# Patient Record
Sex: Female | Born: 1960 | Hispanic: No | Marital: Married | Smoking: Never smoker
Health system: Southern US, Community
[De-identification: ages and names within clinical notes are randomized; demographics above are authoritative.]

## PROBLEM LIST (undated history)

## (undated) HISTORY — PX: ABDOMINAL HYSTERECTOMY: SHX81

---

## 2007-04-20 HISTORY — PX: CHOLECYSTECTOMY: SHX55

## 2012-04-19 HISTORY — PX: BREAST BIOPSY: SHX20

## 2016-11-10 LAB — BASIC METABOLIC PANEL
BUN: 11 (ref 4–21)
CO2: 27 — AB (ref 13–22)
Chloride: 105 (ref 99–108)
Creatinine: 0.8 (ref 0.5–1.1)
Glucose: 83
Potassium: 4.9 (ref 3.4–5.3)
Sodium: 141 (ref 137–147)

## 2016-11-10 LAB — LIPID PANEL
Cholesterol: 202 — AB (ref 0–200)
HDL: 113 — AB (ref 35–70)
LDL Cholesterol: 77
Triglycerides: 43 (ref 40–160)

## 2016-11-10 LAB — CBC: RBC: 4.16 (ref 3.87–5.11)

## 2016-11-10 LAB — COMPREHENSIVE METABOLIC PANEL
Albumin: 4.1 (ref 3.5–5.0)
Calcium: 9.8 (ref 8.7–10.7)
GFR calc Af Amer: 99
Globulin: 2.8

## 2016-11-10 LAB — CBC AND DIFFERENTIAL
HCT: 35 — AB (ref 36–46)
Hemoglobin: 11.6 — AB (ref 12.0–16.0)
Platelets: 399 (ref 150–399)
WBC: 7.5

## 2016-11-10 LAB — VITAMIN D 25 HYDROXY (VIT D DEFICIENCY, FRACTURES): Vit D, 25-Hydroxy: 25

## 2016-11-10 LAB — HEPATIC FUNCTION PANEL
ALT: 10 (ref 7–35)
AST: 13 (ref 13–35)
Alkaline Phosphatase: 80 (ref 25–125)
Bilirubin, Total: 0.6

## 2017-01-14 LAB — HM COLONOSCOPY

## 2019-06-05 ENCOUNTER — Ambulatory Visit (INDEPENDENT_AMBULATORY_CARE_PROVIDER_SITE_OTHER): Payer: BC Managed Care – PPO | Admitting: Family Medicine

## 2019-06-05 ENCOUNTER — Encounter: Payer: Self-pay | Admitting: Family Medicine

## 2019-06-05 ENCOUNTER — Other Ambulatory Visit: Payer: Self-pay

## 2019-06-05 VITALS — BP 128/78 | HR 72 | Ht 62.0 in | Wt 148.0 lb

## 2019-06-05 DIAGNOSIS — Z5181 Encounter for therapeutic drug level monitoring: Secondary | ICD-10-CM

## 2019-06-05 DIAGNOSIS — Z7689 Persons encountering health services in other specified circumstances: Secondary | ICD-10-CM

## 2019-06-05 NOTE — Progress Notes (Signed)
Date:  06/05/2019   Name:  Linda Salas   DOB:  06-Jan-1961   MRN:  [id number]   Chief Complaint: Establish Care (From [facility] )  Patient is a 59 year old female who presents for a establish care exam. The patient reports the following problems: no concerns. Health maintenance has been reviewed up to date.   No results found for: CREATININE, BUN, NA, K, CL, CO2 No results found for: CHOL, HDL, LDLCALC, LDLDIRECT, TRIG, CHOLHDL No results found for: TSH No results found for: HGBA1C   Review of Systems  Constitutional: Negative.  Negative for chills, fatigue, fever and unexpected weight change.  HENT: Negative for congestion, ear discharge, ear pain, rhinorrhea, sinus pressure, sneezing and sore throat.   Eyes: Negative for photophobia, pain, discharge, redness and itching.  Respiratory: Negative for cough, shortness of breath, wheezing and stridor.   Cardiovascular: Negative for chest pain, palpitations and leg swelling.  Gastrointestinal: Negative for abdominal pain, blood in stool, constipation, diarrhea, nausea and vomiting.  Endocrine: Negative for cold intolerance, heat intolerance, polydipsia, polyphagia and polyuria.  Genitourinary: Negative for dysuria, flank pain, frequency, hematuria, menstrual problem, pelvic pain, urgency, vaginal bleeding and vaginal discharge.  Musculoskeletal: Negative for arthralgias, back pain and myalgias.  Skin: Negative for rash.  Allergic/Immunologic: Negative for environmental allergies and food allergies.  Neurological: Negative for dizziness, weakness, light-headedness, numbness and headaches.  Hematological: Negative for adenopathy. Does not bruise/bleed easily.  Psychiatric/Behavioral: Negative for dysphoric mood. The patient is not nervous/anxious.     There are no problems to display for this patient.   Allergies  Allergen Reactions  . Hydrochlorothiazide Shortness Of Breath and Other (See Comments)    Insomnia.     Past  Surgical History:  Procedure Laterality Date  . ABDOMINAL HYSTERECTOMY    . CHOLECYSTECTOMY  2009    Social History   Tobacco Use  . Smoking status: Never Smoker  . Smokeless tobacco: Never Used  Substance Use Topics  . Alcohol use: Never  . Drug use: Never     Medication list has been reviewed and updated.  Current Meds  Medication Sig  . Ascorbic Acid (VITAMIN C) 1000 MG tablet Take 1,000 mg by mouth daily.  . fluticasone (FLONASE) 50 MCG/ACT nasal spray Place 2 sprays into both nostrils daily. OTC    PHQ 2/9 Scores 06/05/2019  PHQ - 2 Score 0    BP Readings from Last 3 Encounters:  06/05/19 128/78    Physical Exam Vitals and nursing note reviewed.  Constitutional:      General: She is not in acute distress.    Appearance: She is not diaphoretic.  HENT:     Head: Normocephalic and atraumatic.     Right Ear: Tympanic membrane, ear canal and external ear normal. There is no impacted cerumen.     Left Ear: Tympanic membrane, ear canal and external ear normal. There is no impacted cerumen.     Nose: Nose normal. No congestion or rhinorrhea.  Eyes:     General:        Right eye: No discharge.        Left eye: No discharge.     Conjunctiva/sclera: Conjunctivae normal.     Pupils: Pupils are equal, round, and reactive to light.  Neck:     Thyroid: No thyromegaly.     Vascular: No carotid bruit or JVD.  Cardiovascular:     Rate and Rhythm: Normal rate and regular rhythm.  Pulses: Normal pulses.     Heart sounds: Normal heart sounds, S1 normal and S2 normal. No murmur. No systolic murmur. No diastolic murmur. No friction rub. No gallop. No S3 or S4 sounds.   Pulmonary:     Effort: Pulmonary effort is normal.     Breath sounds: Normal breath sounds. No wheezing, rhonchi or rales.  Abdominal:     General: Bowel sounds are normal.     Palpations: Abdomen is soft. There is no mass.     Tenderness: There is no abdominal tenderness. There is no guarding.    Musculoskeletal:        General: Normal range of motion.     Cervical back: Normal range of motion and neck supple. No tenderness.  Lymphadenopathy:     Cervical: No cervical adenopathy.  Skin:    General: Skin is warm and dry.  Neurological:     Mental Status: She is alert.     Deep Tendon Reflexes: Reflexes are normal and symmetric.     Wt Readings from Last 3 Encounters:  06/05/19 148 lb (67.1 kg)    BP 128/78   Pulse 72   Ht 5\' 2"  (1.575 m)   Wt 148 lb (67.1 kg)   LMP  (Exact Date)   BMI 27.07 kg/m   Assessment and Plan:  1. Establishing care with new doctor, encounter for Patient is establishing care with a new physician having moved here from [place].  Patient has an unremarkable medical history except for hysterectomy and cholecystectomy.  Patient is currently up-to-date on her health maintenance with only the need for breast exam for mammogram and will be returning for physical exam in the recent future.

## 2019-06-15 ENCOUNTER — Encounter: Payer: Self-pay | Admitting: Family Medicine

## 2019-06-15 ENCOUNTER — Ambulatory Visit (INDEPENDENT_AMBULATORY_CARE_PROVIDER_SITE_OTHER): Payer: BC Managed Care – PPO | Admitting: Family Medicine

## 2019-06-15 ENCOUNTER — Other Ambulatory Visit: Payer: Self-pay

## 2019-06-15 VITALS — BP 110/80 | HR 92 | Ht 62.0 in | Wt 139.0 lb

## 2019-06-15 DIAGNOSIS — Z0000 Encounter for general adult medical examination without abnormal findings: Secondary | ICD-10-CM

## 2019-06-15 DIAGNOSIS — Z124 Encounter for screening for malignant neoplasm of cervix: Secondary | ICD-10-CM

## 2019-06-15 NOTE — Progress Notes (Addendum)
Date:  06/15/2019   Name:  Lucianne Smestad   DOB:  1961-04-18   MRN:  [id number]   Chief Complaint: Annual Exam (needs to sign ROI for [facility] mammo/ wants referral for OBGYN)  Patient is a 59 year old female who presents for a comprehensive physical exam. The patient reports the following problems: none. Health maintenance has been reviewed up to date.   Lab Results  Component Value Date   CREATININE 0.8 11/10/2016   BUN 11 11/10/2016   NA 141 11/10/2016   K 4.9 11/10/2016   CL 105 11/10/2016   CO2 27 (A) 11/10/2016   Lab Results  Component Value Date   CHOL 202 (A) 11/10/2016   HDL 113 (A) 11/10/2016   LDLCALC 77 11/10/2016   TRIG 43 11/10/2016   No results found for: TSH No results found for: HGBA1C   Review of Systems  Constitutional: Negative.  Negative for chills, fatigue, fever and unexpected weight change.  HENT: Negative for congestion, ear discharge, ear pain, mouth sores, nosebleeds, rhinorrhea, sinus pressure, sneezing and sore throat.   Eyes: Negative for photophobia, pain, discharge, redness and itching.  Respiratory: Negative for cough, shortness of breath, wheezing and stridor.   Gastrointestinal: Negative for abdominal pain, blood in stool, constipation, diarrhea, nausea and vomiting.  Endocrine: Negative for cold intolerance, heat intolerance, polydipsia, polyphagia and polyuria.  Genitourinary: Negative for dysuria, flank pain, frequency, hematuria, menstrual problem, pelvic pain, urgency, vaginal bleeding and vaginal discharge.  Musculoskeletal: Negative for arthralgias, back pain and myalgias.  Skin: Negative for rash.  Allergic/Immunologic: Negative for environmental allergies and food allergies.  Neurological: Negative for dizziness, weakness, light-headedness, numbness and headaches.  Hematological: Negative for adenopathy. Does not bruise/bleed easily.  Psychiatric/Behavioral: Negative for dysphoric mood. The patient is not nervous/anxious.     There  are no problems to display for this patient.   Allergies  Allergen Reactions  . Hydrochlorothiazide Shortness Of Breath and Other (See Comments)    Insomnia.     Past Surgical History:  Procedure Laterality Date  . ABDOMINAL HYSTERECTOMY    . CHOLECYSTECTOMY  2009    Social History   Tobacco Use  . Smoking status: Never Smoker  . Smokeless tobacco: Never Used  Substance Use Topics  . Alcohol use: Never  . Drug use: Never     Medication list has been reviewed and updated.  Current Meds  Medication Sig  . Ascorbic Acid (VITAMIN C) 1000 MG tablet Take 1,000 mg by mouth daily.  . fluticasone (FLONASE) 50 MCG/ACT nasal spray Place 2 sprays into both nostrils daily. OTC    PHQ 2/9 Scores 06/15/2019 06/05/2019  PHQ - 2 Score 0 0  PHQ- 9 Score 0 -    BP Readings from Last 3 Encounters:  06/15/19 110/80  06/05/19 128/78    Physical Exam Vitals and nursing note reviewed.  Constitutional:      Appearance: Normal appearance. She is well-developed.  HENT:     Head: Normocephalic.     Jaw: There is normal jaw occlusion.     Right Ear: Hearing, tympanic membrane, ear canal and external ear normal.     Left Ear: Hearing, tympanic membrane, ear canal and external ear normal.     Nose: Nose normal. No congestion.     Right Turbinates: Not enlarged, swollen or pale.     Left Turbinates: Not enlarged, swollen or pale.     Mouth/Throat:     Lips: Pink.     Mouth:  Mucous membranes are moist.     Dentition: Normal dentition.     Pharynx: Oropharynx is clear. No pharyngeal swelling, oropharyngeal exudate, posterior oropharyngeal erythema or uvula swelling.  Eyes:     General: Lids are everted, no foreign bodies appreciated. Vision grossly intact. Gaze aligned appropriately. No scleral icterus.       Left eye: No foreign body or hordeolum.     Extraocular Movements: Extraocular movements intact.     Conjunctiva/sclera: Conjunctivae normal.     Right eye: Right conjunctiva is  not injected.     Left eye: Left conjunctiva is not injected.     Pupils: Pupils are equal, round, and reactive to light.  Neck:     Thyroid: No thyroid mass, thyromegaly or thyroid tenderness.     Vascular: No JVD.     Trachea: No tracheal deviation.  Cardiovascular:     Rate and Rhythm: Normal rate and regular rhythm.     Chest Wall: PMI is not displaced.     Pulses: Normal pulses.          Carotid pulses are 2+ on the right side and 2+ on the left side.      Radial pulses are 2+ on the right side and 2+ on the left side.       Femoral pulses are 2+ on the right side and 2+ on the left side.      Popliteal pulses are 2+ on the right side and 2+ on the left side.       Dorsalis pedis pulses are 2+ on the right side and 2+ on the left side.       Posterior tibial pulses are 2+ on the right side and 2+ on the left side.     Heart sounds: Normal heart sounds, S1 normal and S2 normal. No murmur. No systolic murmur. No diastolic murmur. No friction rub. No gallop. No S3 or S4 sounds.   Pulmonary:     Effort: Pulmonary effort is normal. No respiratory distress.     Breath sounds: Normal breath sounds. No decreased air movement. No decreased breath sounds, wheezing, rhonchi or rales.  Chest:     Breasts:        Right: Normal. No swelling, bleeding, inverted nipple, mass, nipple discharge, skin change or tenderness.        Left: Normal. No swelling, bleeding, inverted nipple, mass, nipple discharge, skin change or tenderness.  Abdominal:     General: Bowel sounds are normal.     Palpations: Abdomen is soft. There is no hepatomegaly, splenomegaly or mass.     Tenderness: There is no abdominal tenderness. There is no right CVA tenderness, left CVA tenderness, guarding or rebound.  Genitourinary:    Rectum: Normal. Guaiac result negative. No mass.  Musculoskeletal:        General: No tenderness. Normal range of motion.     Cervical back: Full passive range of motion without pain, normal range  of motion and neck supple.     Right lower leg: No edema.     Left lower leg: No edema.  Lymphadenopathy:     Head:     Right side of head: No submandibular adenopathy.     Left side of head: No submandibular adenopathy.     Cervical: No cervical adenopathy.     Right cervical: No superficial, deep or posterior cervical adenopathy.    Left cervical: No superficial, deep or posterior cervical adenopathy.     Upper  Body:     Right upper body: No supraclavicular or axillary adenopathy.     Left upper body: No supraclavicular or axillary adenopathy.     Lower Body: No right inguinal adenopathy. No left inguinal adenopathy.  Skin:    General: Skin is warm.     Capillary Refill: Capillary refill takes less than 2 seconds.     Findings: No rash.  Neurological:     Mental Status: She is alert and oriented to person, place, and time.     Cranial Nerves: Cranial nerves are intact. No cranial nerve deficit.     Sensory: Sensation is intact.     Motor: Motor function is intact.     Deep Tendon Reflexes: Reflexes normal.     Reflex Scores:      Tricep reflexes are 2+ on the right side and 2+ on the left side.      Bicep reflexes are 2+ on the right side and 2+ on the left side.      Brachioradialis reflexes are 2+ on the right side and 2+ on the left side.      Patellar reflexes are 2+ on the right side and 2+ on the left side.      Achilles reflexes are 2+ on the right side and 2+ on the left side. Psychiatric:        Mood and Affect: Mood is not anxious or depressed.     Wt Readings from Last 3 Encounters:  06/15/19 139 lb (63 kg)  06/05/19 148 lb (67.1 kg)    BP 110/80   Pulse 92   Ht 5\' 2"  (1.575 m)   Wt 139 lb (63 kg)   BMI 25.42 kg/m   Assessment and Plan: 1. Annual physical exam No subjective objective concerns noted during the history and physical exam.  Patient's previous encounter as well as previous labs, and imaging were reviewed.Winter Jocelyn is a 59 y.o. female who  presents today for her Complete Annual Exam. She feels well. She reports exercising . She reports she is sleeping well. Immunizations are reviewed and recommendations provided.   Age appropriate screening tests are discussed. Counseling given for risk factor reduction interventions. - Comprehensive metabolic panel - Lipid Panel With LDL/HDL Ratio - CBC with Differential/Platelet  2. Cervical cancer screening Patient request to be seen by gynecology in the area particularly here and [place] and we are referring patient to [facility] [place] for routine GYN care. - Ambulatory referral to Obstetrics / Gynecology

## 2019-06-15 NOTE — Patient Instructions (Signed)

## 2019-06-16 LAB — LIPID PANEL WITH LDL/HDL RATIO
Cholesterol, Total: 223 mg/dL — ABNORMAL HIGH (ref 100–199)
HDL: 116 mg/dL (ref >39)
LDL Chol Calc (NIH): 100 mg/dL — ABNORMAL HIGH (ref 0–99)
LDL/HDL Ratio: 0.9 ratio (ref 0.0–3.2)
Triglycerides: 39 mg/dL (ref 0–149)
VLDL Cholesterol Cal: 7 mg/dL (ref 5–40)

## 2019-06-16 LAB — COMPREHENSIVE METABOLIC PANEL
ALT: 12 IU/L (ref 0–32)
AST: 17 IU/L (ref 0–40)
Albumin/Globulin Ratio: 1.6 (ref 1.2–2.2)
Albumin: 4.5 g/dL (ref 3.8–4.9)
Alkaline Phosphatase: 99 IU/L (ref 39–117)
BUN/Creatinine Ratio: 8 — ABNORMAL LOW (ref 12–28)
BUN: 7 mg/dL — ABNORMAL LOW (ref 8–27)
Bilirubin Total: 0.4 mg/dL (ref 0.0–1.2)
CO2: 25 mmol/L (ref 20–29)
Calcium: 10 mg/dL (ref 8.7–10.3)
Chloride: 102 mmol/L (ref 96–106)
Creatinine, Ser: 0.91 mg/dL (ref 0.57–1.00)
GFR calc Af Amer: 79 mL/min/{1.73_m2} (ref >59)
GFR calc non Af Amer: 69 mL/min/{1.73_m2} (ref >59)
Globulin, Total: 2.8 g/dL (ref 1.5–4.5)
Glucose: 72 mg/dL (ref 65–99)
Potassium: 4.9 mmol/L (ref 3.5–5.2)
Sodium: 142 mmol/L (ref 134–144)
Total Protein: 7.3 g/dL (ref 6.0–8.5)

## 2019-06-16 LAB — CBC WITH DIFFERENTIAL/PLATELET
Basophils Absolute: 0 10*3/uL (ref 0.0–0.2)
Basos: 0 %
EOS (ABSOLUTE): 0 10*3/uL (ref 0.0–0.4)
Eos: 0 %
Hematocrit: 35.7 % (ref 34.0–46.6)
Hemoglobin: 12.1 g/dL (ref 11.1–15.9)
Immature Grans (Abs): 0 10*3/uL (ref 0.0–0.1)
Immature Granulocytes: 0 %
Lymphocytes Absolute: 3 10*3/uL (ref 0.7–3.1)
Lymphs: 43 %
MCH: 28.5 pg (ref 26.6–33.0)
MCHC: 33.9 g/dL (ref 31.5–35.7)
MCV: 84 fL (ref 79–97)
Monocytes Absolute: 0.5 10*3/uL (ref 0.1–0.9)
Monocytes: 7 %
Neutrophils Absolute: 3.4 10*3/uL (ref 1.4–7.0)
Neutrophils: 50 %
Platelets: 455 10*3/uL — ABNORMAL HIGH (ref 150–450)
RBC: 4.25 x10E6/uL (ref 3.77–5.28)
RDW: 13.2 % (ref 11.7–15.4)
WBC: 7 10*3/uL (ref 3.4–10.8)

## 2019-06-18 ENCOUNTER — Telehealth: Payer: Self-pay | Admitting: Obstetrics and Gynecology

## 2019-06-18 NOTE — Telephone Encounter (Signed)
[facility] referring for Cervical cancer screening. Called and left voicemail for patient to call back to be schedule

## 2019-07-05 ENCOUNTER — Encounter: Payer: BC Managed Care – PPO | Admitting: Advanced Practice Midwife

## 2019-07-25 ENCOUNTER — Encounter: Payer: BC Managed Care – PPO | Admitting: Advanced Practice Midwife

## 2019-07-25 ENCOUNTER — Other Ambulatory Visit: Payer: Self-pay

## 2019-07-25 ENCOUNTER — Encounter: Payer: Self-pay | Admitting: Advanced Practice Midwife

## 2019-07-26 NOTE — Progress Notes (Signed)
This encounter was created in error - please disregard.

## 2019-08-09 ENCOUNTER — Other Ambulatory Visit: Payer: Self-pay | Admitting: Family Medicine

## 2019-08-09 DIAGNOSIS — R922 Inconclusive mammogram: Secondary | ICD-10-CM

## 2019-08-20 ENCOUNTER — Other Ambulatory Visit: Payer: BC Managed Care – PPO

## 2019-08-27 ENCOUNTER — Ambulatory Visit
Admission: RE | Admit: 2019-08-27 | Discharge: 2019-08-27 | Disposition: A | Discharge status: Home / Self Care | Payer: BC Managed Care – PPO | Source: Ambulatory Visit | Attending: Family Medicine | Admitting: Family Medicine

## 2019-08-27 DIAGNOSIS — R922 Inconclusive mammogram: Secondary | ICD-10-CM

## 2020-04-21 ENCOUNTER — Ambulatory Visit
Admission: EM | Admit: 2020-04-21 | Discharge: 2020-04-21 | Disposition: A | Discharge status: Home / Self Care | Payer: BC Managed Care – PPO | Attending: Sports Medicine | Admitting: Sports Medicine

## 2020-04-21 ENCOUNTER — Other Ambulatory Visit: Payer: Self-pay

## 2020-04-21 DIAGNOSIS — R0981 Nasal congestion: Secondary | ICD-10-CM | POA: Diagnosis present

## 2020-04-21 DIAGNOSIS — R059 Cough, unspecified: Secondary | ICD-10-CM | POA: Diagnosis not present

## 2020-04-21 DIAGNOSIS — U071 COVID-19: Secondary | ICD-10-CM | POA: Insufficient documentation

## 2020-04-21 NOTE — Discharge Instructions (Addendum)
Your Covid test and influenza test were sent to the hospital.  You should check my chart as the results may take 24 to 48 hours.  If you are positive, the nurse will call you.  If you are negative you will not get a call.  You should quarantine appropriately until you know your test results.  Any questions or concerns or any symptoms of worsening chest pain or shortness of breath you should go directly to the emergency room or call 911.

## 2020-04-21 NOTE — ED Triage Notes (Signed)
Pt with cough and congestion starting on Thursday.

## 2020-04-21 NOTE — ED Provider Notes (Signed)
[facility]    CSN: [id number] Arrival date & time: 04/21/20  1003      History   Chief Complaint Chief Complaint  Patient presents with  . Cough    HPI Linda Salas is a 60 y.o. female.   Patient pleasant 60 year old female who presents with her husband for evaluation of 4 to 5 days of a dry nonproductive cough.  She denies any fevers or chills.  No sore throat headache shortness of breath or chest pain.  No abdominal pain.  No nausea vomiting or diarrhea.  She has been taking some Mucinex DM over-the-counter with limited success.  Denies any Covid exposure.  She is not vaccinated currently.  No red flag signs or symptoms elicited on history.     History reviewed. No pertinent past medical history.  There are no problems to display for this patient.   Past Surgical History:  Procedure Laterality Date  . ABDOMINAL HYSTERECTOMY    . BREAST BIOPSY Left 2014  . CHOLECYSTECTOMY  2009    OB History   No obstetric history on file.      Home Medications    Prior to Admission medications   Medication Sig Start Date End Date Taking? Authorizing Provider  Ascorbic Acid (VITAMIN C) 1000 MG tablet Take 1,000 mg by mouth daily.    [provider]  cholecalciferol (VITAMIN D3) 25 MCG (1000 UNIT) tablet Take 1,000 Units by mouth daily.    [provider]  fluticasone (FLONASE) 50 MCG/ACT nasal spray Place 2 sprays into both nostrils daily. OTC    [provider]    Family History History reviewed. No pertinent family history.  Social History Social History   Tobacco Use  . Smoking status: Never Smoker  . Smokeless tobacco: Never Used  Vaping Use  . Vaping Use: Never used  Substance Use Topics  . Alcohol use: Never  . Drug use: Never     Allergies   Hydrochlorothiazide   Review of Systems Review of Systems  Constitutional: Negative for activity change, appetite change, chills and fatigue.  HENT: Negative for congestion,  ear pain, sinus pressure, sinus pain and sore throat.   Eyes: Negative for pain.  Respiratory: Positive for cough. Negative for shortness of breath.   Cardiovascular: Negative for chest pain.  Gastrointestinal: Negative for abdominal pain.  Genitourinary: Negative for dysuria.  Skin: Negative for color change, pallor, rash and wound.  Neurological: Negative for dizziness, syncope, weakness, light-headedness, numbness and headaches.  All other systems reviewed and are negative.    Physical Exam Triage Vital Signs ED Triage Vitals  Enc Vitals Group     BP 04/21/20 1041 126/66     Pulse Rate 04/21/20 1041 88     Resp 04/21/20 1041 16     Temp 04/21/20 1041 98.3 F (36.8 C)     Temp Source 04/21/20 1041 Oral     SpO2 04/21/20 1041 100 %     Weight 04/21/20 1028 135 lb (61.2 kg)     Height 04/21/20 1028 5\' 2"  (1.575 m)     Head Circumference --      Peak Flow --      Pain Score 04/21/20 1027 0     Pain Loc --      Pain Edu? --      Excl. in GC? --    No data found.  Updated Vital Signs BP 126/66 (BP Location: Left Arm)   Pulse 88   Temp 98.3 F (  36.8 C) (Oral)   Resp 16   Ht 5\' 2"  (1.575 m)   Wt 61.2 kg   SpO2 100%   BMI 24.69 kg/m   Visual Acuity Right Eye Distance:   Left Eye Distance:   Bilateral Distance:    Right Eye Near:   Left Eye Near:    Bilateral Near:     Physical Exam Vitals and nursing note reviewed.  Constitutional:      Appearance: Normal appearance. She is obese.  HENT:     Head: Normocephalic and atraumatic.     Right Ear: Tympanic membrane normal.     Left Ear: Tympanic membrane normal.     Nose: Congestion present. No rhinorrhea.     Mouth/Throat:     Mouth: Mucous membranes are moist.     Pharynx: Posterior oropharyngeal erythema present. No oropharyngeal exudate.  Eyes:     Extraocular Movements: Extraocular movements intact.     Conjunctiva/sclera: Conjunctivae normal.     Pupils: Pupils are equal, round, and reactive to light.   Cardiovascular:     Rate and Rhythm: Normal rate and regular rhythm.     Pulses: Normal pulses.     Heart sounds: Normal heart sounds. No murmur heard. No friction rub. No gallop.   Pulmonary:     Effort: Pulmonary effort is normal. No respiratory distress.     Breath sounds: Normal breath sounds. No stridor. No wheezing, rhonchi or rales.  Musculoskeletal:     Cervical back: Normal range of motion and neck supple. No rigidity.  Lymphadenopathy:     Cervical: No cervical adenopathy.  Skin:    General: Skin is warm and dry.     Capillary Refill: Capillary refill takes less than 2 seconds.  Neurological:     General: No focal deficit present.     Mental Status: She is alert and oriented to person, place, and time.  Psychiatric:        Mood and Affect: Mood normal.        Behavior: Behavior normal.      UC Treatments / Results  Labs (all labs ordered are listed, but only abnormal results are displayed) Labs Reviewed  SARS CORONAVIRUS 2 (TAT 6-24 HRS)    EKG   Radiology No results found.  Procedures Procedures (including critical care time)  Medications Ordered in UC Medications - No data to display  Initial Impression / Assessment and Plan / UC Course  I have reviewed the triage vital signs and the nursing notes.  Pertinent labs & imaging results that were available during my care of the patient were reviewed by me and considered in my medical decision making (see chart for details).   Clinical impression: 4 to 5 days of dry nonproductive cough.  No other symptoms were offered by the patient.  Her physical exam and vitals are reassuring.  Treatment plan: 1.  The findings and treatment plan were discussed in detail with the patient.  The patient was in agreement. 2.  Go ahead and test her for Covid.  We do not have any rapid test available.  We will send it to the hospital.  We will contact the patient if she is positive.  I have asked her to go ahead and  quarantine and isolate pending the results from her test. 3.  Supportive care over-the-counter meds as needed, Tylenol Motrin for fever discomfort. 4.  She stays at home and does not work outside the home so no work note is necessary.  5.  Red flag signs and symptoms were discussed in detail and when to seek out immediate medical attention. 6.  Should follow-up with [place] as needed.     Final Clinical Impressions(s) / UC Diagnoses   Final diagnoses:  Cough  Head congestion     Discharge Instructions     Your Covid test and influenza test were sent to the hospital.  You should check my chart as the results may take 24 to 48 hours.  If you are positive, the nurse will call you.  If you are negative you will not get a call.  You should quarantine appropriately until you know your test results.  Any questions or concerns or any symptoms of worsening chest pain or shortness of breath you should go directly to the emergency room or call 911.    ED Prescriptions    None     PDMP not reviewed this encounter.   Delton See, MD 04/23/20 2238

## 2020-04-22 LAB — SARS CORONAVIRUS 2 (TAT 6-24 HRS): SARS Coronavirus 2: POSITIVE — AB

## 2020-06-17 ENCOUNTER — Ambulatory Visit (INDEPENDENT_AMBULATORY_CARE_PROVIDER_SITE_OTHER): Payer: BC Managed Care – PPO | Admitting: Family Medicine

## 2020-06-17 ENCOUNTER — Encounter: Payer: Self-pay | Admitting: Family Medicine

## 2020-06-17 ENCOUNTER — Other Ambulatory Visit (HOSPITAL_COMMUNITY)
Admission: RE | Admit: 2020-06-17 | Discharge: 2020-06-17 | Disposition: A | Discharge status: Home / Self Care | Payer: BC Managed Care – PPO | Source: Ambulatory Visit | Attending: Family Medicine | Admitting: Family Medicine

## 2020-06-17 ENCOUNTER — Other Ambulatory Visit: Payer: Self-pay

## 2020-06-17 VITALS — BP 120/68 | HR 88 | Ht 62.0 in | Wt 134.0 lb

## 2020-06-17 DIAGNOSIS — Z1231 Encounter for screening mammogram for malignant neoplasm of breast: Secondary | ICD-10-CM

## 2020-06-17 DIAGNOSIS — Z0000 Encounter for general adult medical examination without abnormal findings: Secondary | ICD-10-CM

## 2020-06-17 DIAGNOSIS — Z124 Encounter for screening for malignant neoplasm of cervix: Secondary | ICD-10-CM | POA: Diagnosis not present

## 2020-06-17 LAB — HEMOCCULT GUIAC POC 1CARD (OFFICE): Fecal Occult Blood, POC: NEGATIVE

## 2020-06-17 NOTE — Progress Notes (Addendum)
Date:  06/17/2020   Name:  Linda Salas   DOB:  [birth year]   MRN:  [id number]   Chief Complaint: Annual Exam (Breast exam and pap)  Patient is a 60 year old female who presents for a comprehensive physical exam. The patient reports the following problems: none. Health maintenance has been reviewed up to date.   Lab Results  Component Value Date   CREATININE 0.91 06/15/2019   BUN 7 (L) 06/15/2019   NA 142 06/15/2019   K 4.9 06/15/2019   CL 102 06/15/2019   CO2 25 06/15/2019   Lab Results  Component Value Date   CHOL 223 (H) 06/15/2019   HDL 116 06/15/2019   LDLCALC 100 (H) 06/15/2019   TRIG 39 06/15/2019   No results found for: TSH No results found for: HGBA1C Lab Results  Component Value Date   WBC 7.0 06/15/2019   HGB 12.1 06/15/2019   HCT 35.7 06/15/2019   MCV 84 06/15/2019   PLT 455 (H) 06/15/2019   Lab Results  Component Value Date   ALT 12 06/15/2019   AST 17 06/15/2019   ALKPHOS 99 06/15/2019   BILITOT 0.4 06/15/2019     Review of Systems  Constitutional: Negative.  Negative for chills, fatigue, fever and unexpected weight change.  HENT: Negative for congestion, ear discharge, ear pain, rhinorrhea, sinus pressure, sneezing and sore throat.   Eyes: Negative for double vision, photophobia, pain, discharge, redness and itching.  Respiratory: Negative for cough, shortness of breath, wheezing and stridor.   Gastrointestinal: Negative for abdominal pain, blood in stool, constipation, diarrhea, nausea and vomiting.  Endocrine: Negative for cold intolerance, heat intolerance, polydipsia, polyphagia and polyuria.  Genitourinary: Negative for dysuria, flank pain, frequency, hematuria, menstrual problem, pelvic pain, urgency, vaginal bleeding and vaginal discharge.  Musculoskeletal: Negative for arthralgias, back pain and myalgias.  Skin: Negative for rash.  Allergic/Immunologic: Negative for environmental allergies and food allergies.  Neurological: Negative for  dizziness, weakness, light-headedness, numbness and headaches.  Hematological: Negative for adenopathy. Does not bruise/bleed easily.  Psychiatric/Behavioral: Negative for dysphoric mood. The patient is not nervous/anxious.     There are no problems to display for this patient.   Allergies  Allergen Reactions  . Hydrochlorothiazide Shortness Of Breath and Other (See Comments)    Insomnia.     Past Surgical History:  Procedure Laterality Date  . ABDOMINAL HYSTERECTOMY    . BREAST BIOPSY Left 2014  . CHOLECYSTECTOMY  2009    Social History   Tobacco Use  . Smoking status: Never Smoker  . Smokeless tobacco: Never Used  Vaping Use  . Vaping Use: Never used  Substance Use Topics  . Alcohol use: Never  . Drug use: Never     Medication list has been reviewed and updated.  Current Meds  Medication Sig  . Ascorbic Acid (VITAMIN C) 1000 MG tablet Take 1,000 mg by mouth daily.  . cholecalciferol (VITAMIN D3) 25 MCG (1000 UNIT) tablet Take 1,000 Units by mouth daily.  . fluticasone (FLONASE) 50 MCG/ACT nasal spray Place 2 sprays into both nostrils daily. OTC    PHQ 2/9 Scores 06/17/2020 06/15/2019 06/05/2019  PHQ - 2 Score 0 0 0  PHQ- 9 Score 0 0 -    GAD 7 : Generalized Anxiety Score 06/17/2020 06/15/2019  Nervous, Anxious, on Edge 0 0  Control/stop worrying 1 0  Worry too much - different things 0 0  Trouble relaxing 0 0  Restless 0 0  Easily annoyed or  irritable 0 0  Afraid - awful might happen 0 0  Total GAD 7 Score 1 0  Anxiety Difficulty Not difficult at all -    BP Readings from Last 3 Encounters:  06/17/20 120/68  04/21/20 126/66  07/25/19 127/82    Physical Exam Vitals and nursing note reviewed. Exam conducted with a chaperone present.  Constitutional:      Appearance: Normal appearance. She is well-developed, well-groomed, normal weight and well-nourished.  HENT:     Head: Normocephalic.     Jaw: There is normal jaw occlusion.     Right Ear: Hearing,  tympanic membrane, ear canal and external ear normal.     Left Ear: Hearing, tympanic membrane, ear canal and external ear normal.     Nose: Nose normal.     Mouth/Throat:     Lips: Pink.     Mouth: Oropharynx is clear and moist. Mucous membranes are pale.     Dentition: Normal dentition.     Palate: No mass.     Pharynx: Oropharynx is clear. Uvula midline.  Eyes:     General: Lids are normal. Vision grossly intact. Gaze aligned appropriately. No scleral icterus.       Right eye: No foreign body.        Left eye: No foreign body or hordeolum.     Extraocular Movements: Extraocular movements intact and EOM normal.     Conjunctiva/sclera: Conjunctivae normal.     Right eye: Right conjunctiva is not injected.     Left eye: Left conjunctiva is not injected.     Pupils: Pupils are equal, round, and reactive to light.     Funduscopic exam:    Right eye: Red reflex present.        Left eye: Red reflex present. Neck:     Thyroid: No thyroid mass, thyromegaly or thyroid tenderness.     Vascular: Normal carotid pulses. No carotid bruit, hepatojugular reflux or JVD.     Trachea: Trachea and phonation normal. No tracheal deviation.  Cardiovascular:     Rate and Rhythm: Normal rate and regular rhythm.     Chest Wall: PMI is not displaced.     Pulses: Normal pulses and intact distal pulses.          Carotid pulses are 2+ on the right side and 2+ on the left side.      Radial pulses are 2+ on the right side and 2+ on the left side.       Femoral pulses are 2+ on the right side and 2+ on the left side.      Popliteal pulses are 2+ on the right side and 2+ on the left side.       Dorsalis pedis pulses are 2+ on the right side and 2+ on the left side.       Posterior tibial pulses are 2+ on the right side and 2+ on the left side.     Heart sounds: Normal heart sounds, S1 normal and S2 normal. Heart sounds not distant. No murmur heard.  No systolic murmur is present.  No diastolic murmur is  present. No friction rub. No gallop. No S3 or S4 sounds.   Pulmonary:     Effort: Pulmonary effort is normal. No respiratory distress.     Breath sounds: Normal breath sounds. No decreased breath sounds, wheezing, rhonchi or rales.  Chest:     Chest wall: No mass.  Breasts:     Right: Normal. No  swelling, bleeding, inverted nipple, mass, nipple discharge, skin change, tenderness, axillary adenopathy or supraclavicular adenopathy.     Left: Normal. No swelling, bleeding, inverted nipple, mass, nipple discharge, skin change, tenderness, axillary adenopathy or supraclavicular adenopathy.    Abdominal:     General: Bowel sounds are normal.     Palpations: Abdomen is soft. There is no hepatomegaly, splenomegaly, hepatosplenomegaly or mass.     Tenderness: There is no abdominal tenderness. There is no guarding or rebound.     Hernia: No hernia is present. There is no hernia in the umbilical area, ventral area, left inguinal area or right inguinal area.  Genitourinary:    Exam position: Lithotomy position.     Pubic Area: No rash.      Labia:        Right: No rash or tenderness.        Left: No rash or tenderness.      Vagina: Normal.     Uterus: Absent.      Adnexa: Right adnexa normal and left adnexa normal.       Right: No mass, tenderness or fullness.         Left: No mass, tenderness or fullness.       Rectum: Normal. Guaiac result negative. No mass.  Musculoskeletal:        General: No tenderness or edema. Normal range of motion.     Cervical back: Normal, full passive range of motion without pain, normal range of motion and neck supple.     Thoracic back: Normal.     Lumbar back: Normal.     Right lower leg: No edema.     Left lower leg: No edema.  Lymphadenopathy:     Head:     Right side of head: No submental or submandibular adenopathy.     Left side of head: No submental or submandibular adenopathy.     Cervical: No cervical adenopathy.     Right cervical: No superficial,  deep or posterior cervical adenopathy.    Left cervical: No superficial, deep or posterior cervical adenopathy.     Upper Body:     Right upper body: No supraclavicular or axillary adenopathy.     Left upper body: No supraclavicular or axillary adenopathy.     Lower Body: No right inguinal adenopathy. No left inguinal adenopathy.  Skin:    General: Skin is warm.     Capillary Refill: Capillary refill takes less than 2 seconds.     Findings: No rash.  Neurological:     Mental Status: She is alert and oriented to person, place, and time.     Cranial Nerves: Cranial nerves are intact. No cranial nerve deficit.     Sensory: Sensation is intact.     Motor: Motor function is intact.     Coordination: Coordination is intact.     Deep Tendon Reflexes: Strength normal and reflexes are normal and symmetric. Reflexes normal.  Psychiatric:        Mood and Affect: Mood and affect normal. Mood is not anxious or depressed.        Behavior: Behavior is cooperative.     Wt Readings from Last 3 Encounters:  06/17/20 134 lb (60.8 kg)  04/21/20 135 lb (61.2 kg)  07/25/19 140 lb (63.5 kg)    BP 120/68   Pulse 88   Ht 5\' 2"  (1.575 m)   Wt 134 lb (60.8 kg)   BMI 24.51 kg/m   Assessment and Plan:  1. Annual physical exam Patient with annual physical exam.  There was no subjective nor objective concerns noted on history and physical exam.Linda Salas is a 60 y.o. female who presents today for her Complete Annual Exam. She feels well. She reports exercising . She reports she is sleeping well. Immunizations are reviewed and recommendations provided.   Age appropriate screening tests are discussed. Counseling given for risk factor reduction interventions.  Pain a CBC CMP and lipid panel.  We will also cervical cancer screening with Pap smear and guaiac negative. - CBC with Differential/Platelet - Comprehensive metabolic panel - Lipid Panel With LDL/HDL Ratio - Cytology - PAP - POCT Occult Blood  Stool  2. Breast cancer screening by mammogram Breast exam was normal without palpable masses.  We will obtain a bilateral breast screening and mammogram. - MM 3D SCREEN BREAST BILATERAL; Future  3. Cervical cancer screening Cervical cancer screening was done with Pap smear of the vaginal cuff.

## 2020-06-18 LAB — CBC WITH DIFFERENTIAL/PLATELET
Basophils Absolute: 0 10*3/uL (ref 0.0–0.2)
Basos: 1 %
EOS (ABSOLUTE): 0 10*3/uL (ref 0.0–0.4)
Eos: 1 %
Hematocrit: 35 % (ref 34.0–46.6)
Hemoglobin: 11.9 g/dL (ref 11.1–15.9)
Immature Grans (Abs): 0 10*3/uL (ref 0.0–0.1)
Immature Granulocytes: 0 %
Lymphocytes Absolute: 2.6 10*3/uL (ref 0.7–3.1)
Lymphs: 40 %
MCH: 28.5 pg (ref 26.6–33.0)
MCHC: 34 g/dL (ref 31.5–35.7)
MCV: 84 fL (ref 79–97)
Monocytes Absolute: 0.5 10*3/uL (ref 0.1–0.9)
Monocytes: 8 %
Neutrophils Absolute: 3.3 10*3/uL (ref 1.4–7.0)
Neutrophils: 50 %
Platelets: 446 10*3/uL (ref 150–450)
RBC: 4.18 x10E6/uL (ref 3.77–5.28)
RDW: 12 % (ref 11.7–15.4)
WBC: 6.4 10*3/uL (ref 3.4–10.8)

## 2020-06-18 LAB — COMPREHENSIVE METABOLIC PANEL
ALT: 11 IU/L (ref 0–32)
AST: 17 IU/L (ref 0–40)
Albumin/Globulin Ratio: 1.6 (ref 1.2–2.2)
Albumin: 4.6 g/dL (ref 3.8–4.8)
Alkaline Phosphatase: 80 IU/L (ref 44–121)
BUN/Creatinine Ratio: 13 (ref 12–28)
BUN: 10 mg/dL (ref 8–27)
Bilirubin Total: 0.4 mg/dL (ref 0.0–1.2)
CO2: 22 mmol/L (ref 20–29)
Calcium: 10.3 mg/dL (ref 8.7–10.3)
Chloride: 102 mmol/L (ref 96–106)
Creatinine, Ser: 0.79 mg/dL (ref 0.57–1.00)
Globulin, Total: 2.8 g/dL (ref 1.5–4.5)
Glucose: 87 mg/dL (ref 65–99)
Potassium: 4.4 mmol/L (ref 3.5–5.2)
Sodium: 142 mmol/L (ref 134–144)
Total Protein: 7.4 g/dL (ref 6.0–8.5)
eGFR: 85 mL/min/{1.73_m2} (ref >59)

## 2020-06-18 LAB — LIPID PANEL WITH LDL/HDL RATIO
Cholesterol, Total: 202 mg/dL — ABNORMAL HIGH (ref 100–199)
HDL: 115 mg/dL (ref >39)
LDL Chol Calc (NIH): 79 mg/dL (ref 0–99)
LDL/HDL Ratio: 0.7 ratio (ref 0.0–3.2)
Triglycerides: 39 mg/dL (ref 0–149)
VLDL Cholesterol Cal: 8 mg/dL (ref 5–40)

## 2020-06-19 LAB — CYTOLOGY - PAP
Comment: NEGATIVE
Diagnosis: NEGATIVE
High risk HPV: NEGATIVE

## 2020-08-27 ENCOUNTER — Ambulatory Visit
Admission: RE | Admit: 2020-08-27 | Discharge: 2020-08-27 | Disposition: A | Discharge status: Home / Self Care | Payer: BC Managed Care – PPO | Source: Ambulatory Visit | Attending: Family Medicine | Admitting: Family Medicine

## 2020-08-27 ENCOUNTER — Other Ambulatory Visit: Payer: Self-pay

## 2020-08-27 DIAGNOSIS — Z1231 Encounter for screening mammogram for malignant neoplasm of breast: Secondary | ICD-10-CM | POA: Diagnosis present

## 2021-06-19 ENCOUNTER — Other Ambulatory Visit: Payer: Self-pay

## 2021-06-19 ENCOUNTER — Ambulatory Visit (INDEPENDENT_AMBULATORY_CARE_PROVIDER_SITE_OTHER): Payer: BC Managed Care – PPO | Admitting: Family Medicine

## 2021-06-19 ENCOUNTER — Encounter: Payer: Self-pay | Admitting: Family Medicine

## 2021-06-19 VITALS — BP 128/70 | HR 84 | Ht 62.0 in | Wt 136.0 lb

## 2021-06-19 DIAGNOSIS — E7800 Pure hypercholesterolemia, unspecified: Secondary | ICD-10-CM

## 2021-06-19 DIAGNOSIS — Z1231 Encounter for screening mammogram for malignant neoplasm of breast: Secondary | ICD-10-CM | POA: Diagnosis not present

## 2021-06-19 DIAGNOSIS — Z0000 Encounter for general adult medical examination without abnormal findings: Secondary | ICD-10-CM

## 2021-06-19 DIAGNOSIS — Z1211 Encounter for screening for malignant neoplasm of colon: Secondary | ICD-10-CM

## 2021-06-19 LAB — HEMOCCULT GUIAC POC 1CARD (OFFICE): Fecal Occult Blood, POC: NEGATIVE

## 2021-06-19 NOTE — Progress Notes (Signed)
Date:  06/19/2021   Name:  Linda Salas   DOB:  Aug 31, 1960   MRN:  [id number]   Chief Complaint: Annual Exam  Patient is a 61 year old female who presents for a comprehensive physical exam. The patient reports the following problems: none. Health maintenance has been reviewed colonoscopy/mammography.     Lab Results  Component Value Date   NA 142 06/17/2020   K 4.4 06/17/2020   CO2 22 06/17/2020   GLUCOSE 87 06/17/2020   BUN 10 06/17/2020   CREATININE 0.79 06/17/2020   CALCIUM 10.3 06/17/2020   EGFR 85 06/17/2020   GFRNONAA 69 06/15/2019   Lab Results  Component Value Date   CHOL 202 (H) 06/17/2020   HDL 115 06/17/2020   LDLCALC 79 06/17/2020   TRIG 39 06/17/2020   No results found for: TSH No results found for: HGBA1C Lab Results  Component Value Date   WBC 6.4 06/17/2020   HGB 11.9 06/17/2020   HCT 35.0 06/17/2020   MCV 84 06/17/2020   PLT 446 06/17/2020   Lab Results  Component Value Date   ALT 11 06/17/2020   AST 17 06/17/2020   ALKPHOS 80 06/17/2020   BILITOT 0.4 06/17/2020   Lab Results  Component Value Date   VD25OH 25 11/10/2016     Review of Systems  Constitutional:  Negative for chills and fever.  HENT:  Negative for drooling, ear discharge, ear pain and sore throat.   Respiratory:  Negative for cough, shortness of breath and wheezing.   Cardiovascular:  Negative for chest pain, palpitations and leg swelling.  Gastrointestinal:  Negative for abdominal pain, blood in stool, constipation, diarrhea and nausea.  Endocrine: Negative for polydipsia.  Genitourinary:  Negative for dysuria, frequency, hematuria and urgency.  Musculoskeletal:  Negative for back pain, myalgias and neck pain.  Skin:  Negative for rash.  Allergic/Immunologic: Negative for environmental allergies.  Neurological:  Negative for dizziness and headaches.  Hematological:  Does not bruise/bleed easily.  Psychiatric/Behavioral:  Negative for suicidal ideas. The patient is not  nervous/anxious.    There are no problems to display for this patient.   Allergies  Allergen Reactions   Hydrochlorothiazide Shortness Of Breath and Other (See Comments)    Insomnia.     Past Surgical History:  Procedure Laterality Date   ABDOMINAL HYSTERECTOMY     BREAST BIOPSY Left 2014   neg/ Bx/clip   CHOLECYSTECTOMY  2009    Social History   Tobacco Use   Smoking status: Never   Smokeless tobacco: Never  Vaping Use   Vaping Use: Never used  Substance Use Topics   Alcohol use: Never   Drug use: Never     Medication list has been reviewed and updated.  No outpatient medications have been marked as taking for the 06/19/21 encounter (Office Visit) with Duanne Limerick, MD.    [facility] 2/9 Scores 06/19/2021 06/17/2020 06/15/2019 06/05/2019  PHQ - 2 Score 0 0 0 0  PHQ- 9 Score 0 0 0 -    GAD 7 : Generalized Anxiety Score 06/19/2021 06/17/2020 06/15/2019  Nervous, Anxious, on Edge 0 0 0  Control/stop worrying 0 1 0  Worry too much - different things 0 0 0  Trouble relaxing 0 0 0  Restless 0 0 0  Easily annoyed or irritable 0 0 0  Afraid - awful might happen 0 0 0  Total GAD 7 Score 0 1 0  Anxiety Difficulty Not difficult at all Not difficult  at all -    BP Readings from Last 3 Encounters:  06/19/21 128/70  06/17/20 120/68  04/21/20 126/66    Physical Exam Vitals and nursing note reviewed. Exam conducted with a chaperone present.  Constitutional:      General: She is not in acute distress.    Appearance: She is well-developed. She is not diaphoretic.  HENT:     Head: Normocephalic and atraumatic.     Jaw: There is normal jaw occlusion.     Right Ear: Hearing, tympanic membrane, ear canal and external ear normal.     Left Ear: Hearing, tympanic membrane, ear canal and external ear normal.     Nose: Nose normal.     Mouth/Throat:     Lips: Pink.     Mouth: Mucous membranes are moist. Mucous membranes are pale.     Dentition: Normal dentition. No dental tenderness.      Tongue: No lesions.     Palate: No mass.     Pharynx: Oropharynx is clear.     Comments: palenailbed Eyes:     General: Lids are normal. Vision grossly intact. Gaze aligned appropriately. No scleral icterus.       Right eye: No discharge.        Left eye: No foreign body, discharge or hordeolum.     Conjunctiva/sclera: Conjunctivae normal.     Right eye: Right conjunctiva is not injected.     Left eye: Left conjunctiva is not injected.     Pupils: Pupils are equal, round, and reactive to light.     Funduscopic exam:    Right eye: Red reflex present.        Left eye: Red reflex present. Neck:     Thyroid: No thyroid mass, thyromegaly or thyroid tenderness.     Vascular: Normal carotid pulses. No carotid bruit, hepatojugular reflux or JVD.     Trachea: Trachea and phonation normal. No tracheal deviation.  Cardiovascular:     Rate and Rhythm: Normal rate and regular rhythm.     Chest Wall: PMI is not displaced. No thrill.     Pulses: Normal pulses.          Carotid pulses are 2+ on the right side and 2+ on the left side.      Radial pulses are 2+ on the right side and 2+ on the left side.       Femoral pulses are 2+ on the right side and 2+ on the left side.      Popliteal pulses are 2+ on the right side and 2+ on the left side.       Dorsalis pedis pulses are 2+ on the right side and 2+ on the left side.       Posterior tibial pulses are 2+ on the right side and 2+ on the left side.     Heart sounds: Normal heart sounds, S1 normal and S2 normal. No murmur heard. No systolic murmur is present.  No diastolic murmur is present.    No friction rub. No gallop. No S3 or S4 sounds.  Pulmonary:     Effort: Pulmonary effort is normal. No respiratory distress.     Breath sounds: Normal breath sounds. No stridor. No decreased breath sounds, wheezing, rhonchi or rales.  Chest:  Breasts:    Right: Normal. No swelling, bleeding, inverted nipple, mass, nipple discharge, skin change or  tenderness.     Left: Normal. No swelling, bleeding, inverted nipple, mass, nipple discharge, skin  change or tenderness.  Abdominal:     General: Bowel sounds are normal.     Palpations: Abdomen is soft. There is no hepatomegaly, splenomegaly or mass.     Tenderness: There is no abdominal tenderness. There is no guarding or rebound.     Hernia: There is no hernia in the left inguinal area or right inguinal area.  Genitourinary:    Rectum: Normal. Guaiac result negative. No mass.  Musculoskeletal:        General: No tenderness. Normal range of motion.     Cervical back: Full passive range of motion without pain, normal range of motion and neck supple.     Right lower leg: No edema.     Left lower leg: No edema.  Lymphadenopathy:     Head:     Right side of head: No submental, submandibular or tonsillar adenopathy.     Left side of head: No submental, submandibular or tonsillar adenopathy.     Cervical: No cervical adenopathy.     Right cervical: No superficial, deep or posterior cervical adenopathy.    Left cervical: No superficial, deep or posterior cervical adenopathy.     Upper Body:     Right upper body: No supraclavicular, axillary or pectoral adenopathy.     Left upper body: No supraclavicular, axillary or pectoral adenopathy.     Lower Body: No right inguinal adenopathy. No left inguinal adenopathy.  Skin:    General: Skin is warm and dry.     Findings: No rash.  Neurological:     Mental Status: She is alert and oriented to person, place, and time.     Cranial Nerves: Cranial nerves 2-12 are intact. No cranial nerve deficit.     Sensory: Sensation is intact.     Motor: Motor function is intact.     Deep Tendon Reflexes: Reflexes are normal and symmetric. Reflexes normal.  Psychiatric:        Mood and Affect: Mood is not anxious or depressed.    Wt Readings from Last 3 Encounters:  06/19/21 136 lb (61.7 kg)  06/17/20 134 lb (60.8 kg)  04/21/20 135 lb (61.2 kg)    BP  128/70   Pulse 84   Ht 5\' 2"  (1.575 m)   Wt 136 lb (61.7 kg)   BMI 24.87 kg/m   Assessment and Plan: Patient is a 61year old female who presents for a comprehensive physical exam. The patient reports the following problems: None. Health maintenance has been reviewed need for mammogram and possible colonoscopy. Patient's chart was reviewed for previous encounters most recent lab most recent imaging in Care Everywhere.Linda Salas is a 61 y.o. female who presents today for her Complete Annual Exam. She feels well. She reports exercising as tolerated. She reports she is sleeping fairly well.    1. Annual physical exam Immunizations are reviewed and recommendations provided.   Age appropriate screening tests are discussed. Counseling given for risk factor reduction interventions.  No subjective/objective concerns noted during history and physical exam.  We will obtain lipid panel, renal function panel and CBC as part of annual physical evaluation. - Lipid Panel With LDL/HDL Ratio - Renal Function Panel - POCT Occult Blood Stool - CBC with Differential/Platelet  2. Breast cancer screening by mammogram Breast exam was performed there is no palpable mass.  We will refer for bilateral breast mammography. - MM 3D SCREEN BREAST BILATERAL  3. Hypercholesterolemia Mild elevation of cholesterol in the past we will check lipid panel for  current status of LDL. - Lipid Panel With LDL/HDL Ratio  4. Colon cancer screening Discussed with patient.  DRE is normal with stool guaiac negative.  We are in the process of investigating when her last colonoscopy and there is some suggestion that is to be repeated in 5 years rather than 10 and we are sending for results to further clarify this.

## 2021-06-20 LAB — CBC WITH DIFFERENTIAL/PLATELET
Basophils Absolute: 0 10*3/uL (ref 0.0–0.2)
Basos: 0 %
EOS (ABSOLUTE): 0 10*3/uL (ref 0.0–0.4)
Eos: 1 %
Hematocrit: 36 % (ref 34.0–46.6)
Hemoglobin: 12 g/dL (ref 11.1–15.9)
Immature Grans (Abs): 0 10*3/uL (ref 0.0–0.1)
Immature Granulocytes: 0 %
Lymphocytes Absolute: 2.7 10*3/uL (ref 0.7–3.1)
Lymphs: 42 %
MCH: 27.8 pg (ref 26.6–33.0)
MCHC: 33.3 g/dL (ref 31.5–35.7)
MCV: 83 fL (ref 79–97)
Monocytes Absolute: 0.5 10*3/uL (ref 0.1–0.9)
Monocytes: 8 %
Neutrophils Absolute: 3.2 10*3/uL (ref 1.4–7.0)
Neutrophils: 49 %
Platelets: 507 10*3/uL — ABNORMAL HIGH (ref 150–450)
RBC: 4.32 x10E6/uL (ref 3.77–5.28)
RDW: 12.1 % (ref 11.7–15.4)
WBC: 6.6 10*3/uL (ref 3.4–10.8)

## 2021-06-20 LAB — RENAL FUNCTION PANEL
Albumin: 4.8 g/dL (ref 3.8–4.8)
BUN/Creatinine Ratio: 10 — ABNORMAL LOW (ref 12–28)
BUN: 8 mg/dL (ref 8–27)
CO2: 25 mmol/L (ref 20–29)
Calcium: 10.2 mg/dL (ref 8.7–10.3)
Chloride: 104 mmol/L (ref 96–106)
Creatinine, Ser: 0.83 mg/dL (ref 0.57–1.00)
Glucose: 79 mg/dL (ref 70–99)
Phosphorus: 3.4 mg/dL (ref 3.0–4.3)
Potassium: 4.6 mmol/L (ref 3.5–5.2)
Sodium: 142 mmol/L (ref 134–144)
eGFR: 80 mL/min/{1.73_m2} (ref >59)

## 2021-06-20 LAB — LIPID PANEL WITH LDL/HDL RATIO
Cholesterol, Total: 204 mg/dL — ABNORMAL HIGH (ref 100–199)
HDL: 122 mg/dL (ref >39)
LDL Chol Calc (NIH): 75 mg/dL (ref 0–99)
LDL/HDL Ratio: 0.6 ratio (ref 0.0–3.2)
Triglycerides: 31 mg/dL (ref 0–149)
VLDL Cholesterol Cal: 7 mg/dL (ref 5–40)

## 2021-08-31 ENCOUNTER — Ambulatory Visit
Admission: RE | Admit: 2021-08-31 | Discharge: 2021-08-31 | Disposition: A | Discharge status: Home / Self Care | Payer: BC Managed Care – PPO | Source: Ambulatory Visit | Attending: Family Medicine | Admitting: Family Medicine

## 2021-08-31 DIAGNOSIS — Z1231 Encounter for screening mammogram for malignant neoplasm of breast: Secondary | ICD-10-CM | POA: Insufficient documentation

## 2022-06-21 ENCOUNTER — Ambulatory Visit (INDEPENDENT_AMBULATORY_CARE_PROVIDER_SITE_OTHER): Payer: BC Managed Care – PPO | Admitting: Family Medicine

## 2022-06-21 ENCOUNTER — Encounter: Payer: Self-pay | Admitting: Family Medicine

## 2022-06-21 VITALS — BP 118/78 | HR 78 | Ht 62.0 in | Wt 138.0 lb

## 2022-06-21 DIAGNOSIS — E7800 Pure hypercholesterolemia, unspecified: Secondary | ICD-10-CM | POA: Diagnosis not present

## 2022-06-21 DIAGNOSIS — Z1211 Encounter for screening for malignant neoplasm of colon: Secondary | ICD-10-CM

## 2022-06-21 DIAGNOSIS — Z0000 Encounter for general adult medical examination without abnormal findings: Secondary | ICD-10-CM | POA: Diagnosis not present

## 2022-06-21 DIAGNOSIS — Z862 Personal history of diseases of the blood and blood-forming organs and certain disorders involving the immune mechanism: Secondary | ICD-10-CM

## 2022-06-21 DIAGNOSIS — Z1231 Encounter for screening mammogram for malignant neoplasm of breast: Secondary | ICD-10-CM

## 2022-06-21 LAB — HEMOCCULT GUIAC POC 1CARD (OFFICE): Fecal Occult Blood, POC: NEGATIVE

## 2022-06-21 NOTE — Progress Notes (Signed)
Date:  06/21/2022   Name:  Linda Salas   DOB:  March 22, 1961   MRN:  [id number]   Chief Complaint: Annual Exam  Patient is a 62 year old female who presents for a comprehensive physical exam. The patient reports the following problems: none. Health maintenance has been reviewed up to date.      Lab Results  Component Value Date   NA 142 06/19/2021   K 4.6 06/19/2021   CO2 25 06/19/2021   GLUCOSE 79 06/19/2021   BUN 8 06/19/2021   CREATININE 0.83 06/19/2021   CALCIUM 10.2 06/19/2021   EGFR 80 06/19/2021   GFRNONAA 69 06/15/2019   Lab Results  Component Value Date   CHOL 204 (H) 06/19/2021   HDL 122 06/19/2021   LDLCALC 75 06/19/2021   TRIG 31 06/19/2021   No results found for: "TSH" No results found for: "HGBA1C" Lab Results  Component Value Date   WBC 6.6 06/19/2021   HGB 12.0 06/19/2021   HCT 36.0 06/19/2021   MCV 83 06/19/2021   PLT 507 (H) 06/19/2021   Lab Results  Component Value Date   ALT 11 06/17/2020   AST 17 06/17/2020   ALKPHOS 80 06/17/2020   BILITOT 0.4 06/17/2020   Lab Results  Component Value Date   VD25OH 25 11/10/2016     Review of Systems  Constitutional: Negative.  Negative for chills, fatigue, fever and unexpected weight change.  HENT:  Negative for congestion, rhinorrhea, sinus pressure, sneezing and sore throat.   Respiratory:  Negative for cough, shortness of breath, wheezing and stridor.   Cardiovascular:  Negative for chest pain, palpitations and leg swelling.  Gastrointestinal:  Negative for abdominal pain, blood in stool, constipation, diarrhea and nausea.  Genitourinary:  Negative for dysuria, flank pain, frequency, hematuria, urgency and vaginal discharge.  Musculoskeletal:  Negative for arthralgias, back pain and myalgias.  Skin:  Negative for rash.  Neurological:  Negative for dizziness, weakness and headaches.  Hematological:  Negative for adenopathy. Does not bruise/bleed easily.  Psychiatric/Behavioral:  Negative for  dysphoric mood. The patient is not nervous/anxious.     There are no problems to display for this patient.   Allergies  Allergen Reactions   Hydrocodone Shortness Of Breath    Past Surgical History:  Procedure Laterality Date   ABDOMINAL HYSTERECTOMY     BREAST BIOPSY Left 2014   neg/ Bx/clip   CHOLECYSTECTOMY  2009    Social History   Tobacco Use   Smoking status: Never   Smokeless tobacco: Never  Vaping Use   Vaping Use: Never used  Substance Use Topics   Alcohol use: Never   Drug use: Never     Medication list has been reviewed and updated.  No outpatient medications have been marked as taking for the 06/21/22 encounter (Office Visit) with Juline Patch, MD.       06/21/2022   10:04 AM 06/19/2021    9:53 AM 06/17/2020    8:52 AM 06/15/2019    9:49 AM  GAD 7 : Generalized Anxiety Score  Nervous, Anxious, on Edge 0 0 0 0  Control/stop worrying 0 0 1 0  Worry too much - different things 0 0 0 0  Trouble relaxing 0 0 0 0  Restless 0 0 0 0  Easily annoyed or irritable 0 0 0 0  Afraid - awful might happen 0 0 0 0  Total GAD 7 Score 0 0 1 0  Anxiety Difficulty  Not difficult  at all Not difficult at all        06/21/2022   10:03 AM 06/19/2021    9:53 AM 06/17/2020    8:52 AM  Depression screen PHQ 2/9  Decreased Interest 0 0 0  Down, Depressed, Hopeless 0 0 0  PHQ - 2 Score 0 0 0  Altered sleeping 0 0 0  Tired, decreased energy 0 0 0  Change in appetite 0 0 0  Feeling bad or failure about yourself  0 0 0  Trouble concentrating 0 0 0  Moving slowly or fidgety/restless 0 0 0  Suicidal thoughts 0 0 0  PHQ-9 Score 0 0 0  Difficult doing work/chores Not difficult at all Not difficult at all     BP Readings from Last 3 Encounters:  06/21/22 118/78  06/19/21 128/70  06/17/20 120/68    Physical Exam Vitals and nursing note reviewed. Exam conducted with a chaperone present.  Constitutional:      General: She is not in acute distress.    Appearance: She is  well-groomed. She is not diaphoretic.  HENT:     Head: Normocephalic and atraumatic.     Right Ear: Hearing, tympanic membrane, ear canal and external ear normal.     Left Ear: Hearing, tympanic membrane, ear canal and external ear normal.     Nose: Nose normal. No congestion or rhinorrhea.     Mouth/Throat:     Lips: Pink.     Mouth: Mucous membranes are moist.     Dentition: Normal dentition.     Tongue: No lesions.     Palate: No mass.     Pharynx: Oropharynx is clear.  Eyes:     General: Lids are normal. Vision grossly intact. Gaze aligned appropriately.        Right eye: No discharge.        Left eye: No discharge.     Extraocular Movements: Extraocular movements intact.     Conjunctiva/sclera: Conjunctivae normal.     Pupils: Pupils are equal, round, and reactive to light.     Funduscopic exam:    Right eye: Red reflex present.        Left eye: Red reflex present. Neck:     Thyroid: No thyromegaly.     Vascular: Normal carotid pulses. No carotid bruit, hepatojugular reflux or JVD.     Trachea: Trachea normal.  Cardiovascular:     Rate and Rhythm: Normal rate and regular rhythm.     Heart sounds: Normal heart sounds, S1 normal and S2 normal. No murmur heard.    No systolic murmur is present.     No diastolic murmur is present.     No friction rub. No gallop. No S3 or S4 sounds.  Pulmonary:     Effort: Pulmonary effort is normal.     Breath sounds: Normal breath sounds. No decreased breath sounds, wheezing, rhonchi or rales.  Chest:  Breasts:    Right: No swelling, bleeding, inverted nipple, mass, nipple discharge, skin change or tenderness.     Left: No swelling, bleeding, inverted nipple, mass, nipple discharge, skin change or tenderness.  Abdominal:     General: Bowel sounds are normal.     Palpations: Abdomen is soft. There is no hepatomegaly, splenomegaly or mass.     Tenderness: There is no abdominal tenderness. There is no guarding.  Genitourinary:    Rectum:  Normal. Guaiac result negative. No mass.  Musculoskeletal:        General: Normal  range of motion.     Cervical back: Normal range of motion and neck supple.     Right lower leg: No edema.     Left lower leg: No edema.  Lymphadenopathy:     Head:     Right side of head: No submental, submandibular or tonsillar adenopathy.     Left side of head: No submental, submandibular or tonsillar adenopathy.     Cervical: No cervical adenopathy.     Right cervical: No superficial, deep or posterior cervical adenopathy.    Left cervical: No superficial, deep or posterior cervical adenopathy.  Skin:    General: Skin is warm and dry.  Neurological:     Mental Status: She is alert.     Deep Tendon Reflexes: Reflexes are normal and symmetric.     Reflex Scores:      Tricep reflexes are 2+ on the right side and 2+ on the left side.      Bicep reflexes are 2+ on the right side and 2+ on the left side.      Patellar reflexes are 2+ on the right side and 2+ on the left side. Psychiatric:        Behavior: Behavior is cooperative.     Wt Readings from Last 3 Encounters:  06/21/22 138 lb (62.6 kg)  06/19/21 136 lb (61.7 kg)  06/17/20 134 lb (60.8 kg)    BP 118/78   Pulse 78   Ht '5\' 2"'$  (1.575 m)   Wt 138 lb (62.6 kg)   SpO2 98%   BMI 25.24 kg/m   Assessment and Plan:  Linda Salas is a 62 y.o. female who presents today for her Complete Annual Exam. She feels well. She reports exercising walking. She reports she is sleeping fairly well.  Immunizations are reviewed and recommendations provided.   Age appropriate screening tests are discussed. Counseling given for risk factor reduction interventions.  1. Annual physical exam No subjective/objective concerns noted HPI, review of systems, review of past medical history medications, and physical exam.  Will check CMP lipid panel and CBC. - Comprehensive Metabolic Panel (CMET) - Lipid Panel With LDL/HDL Ratio - CBC with Differential/Platelet  2.  Breast cancer screening by mammogram Breast exam is normal with no palpable mass or abnormalities noted.  Will obtain bilateral mammography. - MM 3D SCREEN BREAST BILATERAL  3. Hypercholesterolemia Chronic.  Controlled.  Stable.  We have reemphasized low-cholesterol dietary guidelines and will check lipid panel for current status of control. - Lipid Panel With LDL/HDL Ratio  4. Colon cancer screening Direct rectal examination was done with no palpable mass guaiac was noted negative - POCT occult blood stool  5. History of anemia Patient has history of anemia with decreased capillary refill and pale conjunctiva.  Will check CBC for current status of hemoglobin hematocrit and other cell counts.  Otilio Miu, MD

## 2022-06-22 LAB — CBC WITH DIFFERENTIAL/PLATELET
Basophils Absolute: 0 10*3/uL (ref 0.0–0.2)
Basos: 1 %
EOS (ABSOLUTE): 0 10*3/uL (ref 0.0–0.4)
Eos: 0 %
Hematocrit: 36.2 % (ref 34.0–46.6)
Hemoglobin: 12.4 g/dL (ref 11.1–15.9)
Immature Grans (Abs): 0 10*3/uL (ref 0.0–0.1)
Immature Granulocytes: 0 %
Lymphocytes Absolute: 3.5 10*3/uL — ABNORMAL HIGH (ref 0.7–3.1)
Lymphs: 50 %
MCH: 28.7 pg (ref 26.6–33.0)
MCHC: 34.3 g/dL (ref 31.5–35.7)
MCV: 84 fL (ref 79–97)
Monocytes Absolute: 0.5 10*3/uL (ref 0.1–0.9)
Monocytes: 7 %
Neutrophils Absolute: 3 10*3/uL (ref 1.4–7.0)
Neutrophils: 42 %
Platelets: 483 10*3/uL — ABNORMAL HIGH (ref 150–450)
RBC: 4.32 x10E6/uL (ref 3.77–5.28)
RDW: 11.5 % — ABNORMAL LOW (ref 11.7–15.4)
WBC: 7 10*3/uL (ref 3.4–10.8)

## 2022-06-22 LAB — LIPID PANEL WITH LDL/HDL RATIO
Cholesterol, Total: 232 mg/dL — ABNORMAL HIGH (ref 100–199)
HDL: 129 mg/dL (ref >39)
LDL Chol Calc (NIH): 96 mg/dL (ref 0–99)
LDL/HDL Ratio: 0.7 ratio (ref 0.0–3.2)
Triglycerides: 38 mg/dL (ref 0–149)
VLDL Cholesterol Cal: 7 mg/dL (ref 5–40)

## 2022-06-22 LAB — COMPREHENSIVE METABOLIC PANEL
ALT: 12 IU/L (ref 0–32)
AST: 16 IU/L (ref 0–40)
Albumin/Globulin Ratio: 1.6 (ref 1.2–2.2)
Albumin: 4.6 g/dL (ref 3.9–4.9)
Alkaline Phosphatase: 86 IU/L (ref 44–121)
BUN/Creatinine Ratio: 10 — ABNORMAL LOW (ref 12–28)
BUN: 9 mg/dL (ref 8–27)
Bilirubin Total: 0.4 mg/dL (ref 0.0–1.2)
CO2: 24 mmol/L (ref 20–29)
Calcium: 10.2 mg/dL (ref 8.7–10.3)
Chloride: 101 mmol/L (ref 96–106)
Creatinine, Ser: 0.87 mg/dL (ref 0.57–1.00)
Globulin, Total: 2.8 g/dL (ref 1.5–4.5)
Glucose: 82 mg/dL (ref 70–99)
Potassium: 4.5 mmol/L (ref 3.5–5.2)
Sodium: 141 mmol/L (ref 134–144)
Total Protein: 7.4 g/dL (ref 6.0–8.5)
eGFR: 75 mL/min/{1.73_m2} (ref >59)

## 2022-07-04 ENCOUNTER — Other Ambulatory Visit: Payer: Self-pay

## 2022-07-04 ENCOUNTER — Emergency Department: Payer: BC Managed Care – PPO

## 2022-07-04 ENCOUNTER — Emergency Department
Admission: EM | Admit: 2022-07-04 | Discharge: 2022-07-04 | Disposition: A | Discharge status: Home / Self Care | Payer: BC Managed Care – PPO | Attending: Emergency Medicine | Admitting: Emergency Medicine

## 2022-07-04 DIAGNOSIS — X500XXA Overexertion from strenuous movement or load, initial encounter: Secondary | ICD-10-CM | POA: Diagnosis not present

## 2022-07-04 DIAGNOSIS — S4992XA Unspecified injury of left shoulder and upper arm, initial encounter: Secondary | ICD-10-CM | POA: Diagnosis not present

## 2022-07-04 DIAGNOSIS — S46912A Strain of unspecified muscle, fascia and tendon at shoulder and upper arm level, left arm, initial encounter: Secondary | ICD-10-CM | POA: Diagnosis not present

## 2022-07-04 DIAGNOSIS — Y92007 Garden or yard of unspecified non-institutional (private) residence as the place of occurrence of the external cause: Secondary | ICD-10-CM | POA: Insufficient documentation

## 2022-07-04 DIAGNOSIS — R918 Other nonspecific abnormal finding of lung field: Secondary | ICD-10-CM | POA: Diagnosis not present

## 2022-07-04 DIAGNOSIS — R9431 Abnormal electrocardiogram [ECG] [EKG]: Secondary | ICD-10-CM | POA: Diagnosis not present

## 2022-07-04 NOTE — ED Notes (Signed)
Pt discharge to home. Pt VSS, GCS 15, NAD. Pt verbalized understanding of discharge instructions with no additional questions at this time.  

## 2022-07-04 NOTE — ED Provider Notes (Signed)
[facility] Provider Note    Event Date/Time   First MD Initiated Contact with Patient 07/04/22 [phone]     (approximate)   History   Shoulder Pain   HPI  Linda Salas is a 62 y.o. female   presents to the ED with plaint of posterior left shoulder pain that started this morning.  Patient states that she recalls yesterday working in the yard and also pulling a tire that was full of dirt to plant flowers and across the yard.  No over-the-counter medications have been taken.  Patient pain does not radiate and is sharp in nature and a pulling sensation when she extends her upper extremity across her chest.  Patient denies any nausea, vomiting, shortness of breath, dizziness or previous cardiac history.     Physical Exam   Triage Vital Signs: ED Triage Vitals  Enc Vitals Group     BP 07/04/22 0902 136/82     Pulse Rate 07/04/22 0902 80     Resp 07/04/22 0902 18     Temp 07/04/22 0902 98 F (36.7 C)     Temp src --      SpO2 07/04/22 0902 100 %     Weight 07/04/22 0856 136 lb 11 oz (62 kg)     Height 07/04/22 0856 5\' 2"  (1.575 m)     Head Circumference --      Peak Flow --      Pain Score 07/04/22 0856 6     Pain Loc --      Pain Edu? --      Excl. in Colon? --     Most recent vital signs: Vitals:   07/04/22 0902  BP: 136/82  Pulse: 80  Resp: 18  Temp: 98 F (36.7 C)  SpO2: 100%     General: Awake, no distress.  CV:  Good peripheral perfusion.  Heart regular rate rhythm. Resp:  Normal effort.  Lungs are clear bilaterally. Abd:  No distention.  Other:  On examination of left shoulder there is no crepitus with range of motion and no restriction x 4 planes.  No deformity.  Pulses present and strong.  Good muscle strength at 5/5.  There is muscle tenderness on palpation along the medial border of the scapula that reproduces patient's pain.  No swelling or discoloration noted in this area.   ED Results / Procedures / Treatments   Labs (all labs  ordered are listed, but only abnormal results are displayed) Labs Reviewed - No data to display   EKG  Vent. rate 77 BPM PR interval 136 ms QRS duration 84 ms QT/QTcB 372/420 ms P-R-T axes 70 81 64 Normal sinus rhythm Normal ECG No previous ECGs available    RADIOLOGY Chest x-ray images were reviewed and interpreted by myself independent of the radiologist and was negative for acute cardiopulmonary disease.  Radiology reports that there is a shadow which could be nipple related versus a nodule in the lung.  He requested an additional view of the chest with markers.  This was done and reassured that the area in question is nipples identified in relationship to the markers.    PROCEDURES:  Critical Care performed:   Procedures   MEDICATIONS ORDERED IN ED: Medications - No data to display   IMPRESSION / MDM / [place] / ED COURSE  I reviewed the triage vital signs and the nursing notes.   Differential diagnosis includes, but is not limited to, left shoulder pain,  muscle strain, musculoskeletal pain.  62 year old female presents to the ED with complaint of posterior left shoulder pain that started this morning when she woke up.  Patient recalls that she pulled a tire full of dirt across her yard yesterday and woke up sore this morning.  No over-the-counter medications have been taken.  Patient is point tender on palpation of the muscles medial to the scapula.  No cardiac history and no other symptoms.  EKG was performed and noted in normal sinus rhythm with a ventricular rate of 77.  Chest x-ray within normal limits.  On exam we are able to reproduce patient's pain with palpation and is felt to be related to her yard work yesterday.  We discussed over-the-counter anti-inflammatories and also some topical creams that could be used.  Patient is to follow-up with her PCP if any continued problems.      Patient's presentation is most consistent with acute complicated  illness / injury requiring diagnostic workup.  FINAL CLINICAL IMPRESSION(S) / ED DIAGNOSES   Final diagnoses:  Muscle strain of left shoulder, initial encounter     Rx / DC Orders   ED Discharge Orders     None        Note:  This document was prepared using Dragon voice recognition software and may include unintentional dictation errors.   Johnn Hai, PA-C 07/04/22 1247    Delman Kitten, MD 07/04/22 [phone]

## 2022-07-04 NOTE — ED Triage Notes (Signed)
Pt reports pain to the back of her left shoulder that started this am. Pt denies injuries, reports she was sitting in the bed when it started, Pt reports pain does not radiate and is sharp in nature. Pt denies SOB, CP, nausea, or diaphoresis. Pain worse with lifting arm up

## 2022-07-04 NOTE — Discharge Instructions (Addendum)
Follow-up with your primary care provider if any continued problems or concerns.  Take an anti-inflammatory such as ibuprofen, Aleve or even Voltaren gel to the area will help with soreness and pain.  You may also use heat or ice to the area for discomfort.  Rest x-ray and EKG are normal today.

## 2022-07-05 ENCOUNTER — Telehealth: Payer: Self-pay

## 2022-07-05 NOTE — Transitions of Care (Post Inpatient/ED Visit) (Signed)
   07/05/2022  Name: Linda Salas MRN: [id number] DOB: 03/03/1961  Today's TOC FU Call Status: Today's TOC FU Call Status:: Successful TOC FU Call Competed TOC FU Call Complete Date: 07/05/22  Transition Care Management Follow-up Telephone Call Date of Discharge: 07/04/22 Discharge Facility: [facility]) Type of Discharge: Emergency Department Reason for ED Visit: Orthopedic Conditions (pain in shoulder) Orthopedic/Injury Diagnosis: Sprain or Strain How have you been since you were released from the hospital?: Better Any questions or concerns?: No  Items Reviewed: Did you receive and understand the discharge instructions provided?: Yes Medications obtained and verified?: Yes (Medications Reviewed) Any new allergies since your discharge?: No Dietary orders reviewed?: NA Do you have support at home?: Yes People in Home: spouse Name of Support/Comfort Primary Source: [facility] and Equipment/Supplies: [place] Ordered?: NA Any new equipment or medical supplies ordered?: NA  Functional Questionnaire: Do you need assistance with bathing/showering or dressing?: No Do you need assistance with meal preparation?: No Do you need assistance with eating?: No Do you have difficulty maintaining continence: No Do you need assistance with getting out of bed/getting out of a chair/moving?: No Do you have difficulty managing or taking your medications?: No  Folllow up appointments reviewed: PCP Follow-up appointment confirmed?: NA (told by ER "I don't need a follow up") [facility] Follow-up appointment confirmed?: NA Do you need transportation to your follow-up appointment?: No Do you understand care options if your condition(s) worsen?: Yes-patient verbalized understanding    SIGNATURE [facility] [place]

## 2022-09-02 ENCOUNTER — Ambulatory Visit
Admission: RE | Admit: 2022-09-02 | Discharge: 2022-09-02 | Disposition: A | Discharge status: Home / Self Care | Payer: BC Managed Care – PPO | Source: Ambulatory Visit | Attending: Family Medicine | Admitting: Family Medicine

## 2022-09-02 DIAGNOSIS — Z1231 Encounter for screening mammogram for malignant neoplasm of breast: Secondary | ICD-10-CM | POA: Insufficient documentation

## 2023-06-27 ENCOUNTER — Ambulatory Visit (INDEPENDENT_AMBULATORY_CARE_PROVIDER_SITE_OTHER): Payer: BC Managed Care – PPO | Admitting: Family Medicine

## 2023-06-27 ENCOUNTER — Encounter: Payer: Self-pay | Admitting: Family Medicine

## 2023-06-27 VITALS — BP 138/82 | HR 78 | Ht 62.0 in | Wt 137.4 lb

## 2023-06-27 DIAGNOSIS — Z0000 Encounter for general adult medical examination without abnormal findings: Secondary | ICD-10-CM | POA: Diagnosis not present

## 2023-06-27 DIAGNOSIS — E7800 Pure hypercholesterolemia, unspecified: Secondary | ICD-10-CM

## 2023-06-27 DIAGNOSIS — Z1211 Encounter for screening for malignant neoplasm of colon: Secondary | ICD-10-CM

## 2023-06-27 DIAGNOSIS — Z1231 Encounter for screening mammogram for malignant neoplasm of breast: Secondary | ICD-10-CM | POA: Diagnosis not present

## 2023-06-27 DIAGNOSIS — Z862 Personal history of diseases of the blood and blood-forming organs and certain disorders involving the immune mechanism: Secondary | ICD-10-CM

## 2023-06-27 NOTE — Progress Notes (Signed)
 Date:  06/27/2023   Name:  Linda Salas   DOB:  29-Nov-1960   MRN:  [id number]   Chief Complaint: Annual Exam  Patient is a 63 year old female who presents for a comprehensive physical exam. The patient reports the following problems: none. Health maintenance has been reviewed       Lab Results  Component Value Date   NA 141 06/21/2022   K 4.5 06/21/2022   CO2 24 06/21/2022   GLUCOSE 82 06/21/2022   BUN 9 06/21/2022   CREATININE 0.87 06/21/2022   CALCIUM 10.2 06/21/2022   EGFR 75 06/21/2022   GFRNONAA 69 06/15/2019   Lab Results  Component Value Date   CHOL 232 (H) 06/21/2022   HDL 129 06/21/2022   LDLCALC 96 06/21/2022   TRIG 38 06/21/2022   No results found for: "TSH" No results found for: "HGBA1C" Lab Results  Component Value Date   WBC 7.0 06/21/2022   HGB 12.4 06/21/2022   HCT 36.2 06/21/2022   MCV 84 06/21/2022   PLT 483 (H) 06/21/2022   Lab Results  Component Value Date   ALT 12 06/21/2022   AST 16 06/21/2022   ALKPHOS 86 06/21/2022   BILITOT 0.4 06/21/2022   Lab Results  Component Value Date   VD25OH 25 11/10/2016     Review of Systems  Constitutional: Negative.  Negative for chills, fatigue, fever and unexpected weight change.  HENT:  Negative for congestion, ear discharge, ear pain, rhinorrhea, sinus pressure, sneezing and sore throat.   Respiratory:  Negative for cough, shortness of breath, wheezing and stridor.   Gastrointestinal:  Negative for abdominal pain, blood in stool, constipation, diarrhea and nausea.  Genitourinary:  Negative for dysuria, flank pain, frequency, hematuria, urgency and vaginal discharge.  Musculoskeletal:  Negative for arthralgias, back pain and myalgias.  Skin:  Negative for rash.  Neurological:  Negative for dizziness, weakness and headaches.  Hematological:  Negative for adenopathy. Does not bruise/bleed easily.  Psychiatric/Behavioral:  Negative for dysphoric mood. The patient is not nervous/anxious.      There are no active problems to display for this patient.   Allergies  Allergen Reactions   Hydrocodone Shortness Of Breath    Past Surgical History:  Procedure Laterality Date   ABDOMINAL HYSTERECTOMY     BREAST BIOPSY Left 2014   neg/ Bx/clip   CHOLECYSTECTOMY  2009    Social History   Tobacco Use   Smoking status: Never   Smokeless tobacco: Never  Vaping Use   Vaping status: Never Used  Substance Use Topics   Alcohol use: Never   Drug use: Never     Medication list has been reviewed and updated.  No outpatient medications have been marked as taking for the 06/27/23 encounter (Office Visit) with Duanne Limerick, MD.       06/27/2023   10:03 AM 06/21/2022   10:04 AM 06/19/2021    9:53 AM 06/17/2020    8:52 AM  GAD 7 : Generalized Anxiety Score  Nervous, Anxious, on Edge 0 0 0 0  Control/stop worrying 0 0 0 1  Worry too much - different things 1 0 0 0  Trouble relaxing 0 0 0 0  Restless 0 0 0 0  Easily annoyed or irritable 0 0 0 0  Afraid - awful might happen 0 0 0 0  Total GAD 7 Score 1 0 0 1  Anxiety Difficulty Not difficult at all  Not difficult at all Not difficult at  all       06/27/2023   10:03 AM 06/21/2022   10:03 AM 06/19/2021    9:53 AM  Depression screen PHQ 2/9  Decreased Interest 0 0 0  Down, Depressed, Hopeless 0 0 0  PHQ - 2 Score 0 0 0  Altered sleeping 1 0 0  Tired, decreased energy 2 0 0  Change in appetite 0 0 0  Feeling bad or failure about yourself  0 0 0  Trouble concentrating 0 0 0  Moving slowly or fidgety/restless 0 0 0  Suicidal thoughts 0 0 0  PHQ-9 Score 3 0 0  Difficult doing work/chores Not difficult at all Not difficult at all Not difficult at all    BP Readings from Last 3 Encounters:  06/27/23 138/82  07/04/22 136/82  06/21/22 118/78    Physical Exam Vitals and nursing note reviewed.  Constitutional:      General: She is not in acute distress.    Appearance: Normal appearance. She is normal weight. She is not  diaphoretic.  HENT:     Head: Normocephalic and atraumatic.     Jaw: There is normal jaw occlusion.     Right Ear: Hearing, tympanic membrane, ear canal and external ear normal.     Left Ear: Hearing, tympanic membrane, ear canal and external ear normal.     Nose: Nose normal.     Mouth/Throat:     Lips: Pink.     Mouth: Mucous membranes are moist.     Pharynx: Oropharynx is clear. Uvula midline.  Eyes:     General: Lids are normal. Vision grossly intact. Gaze aligned appropriately.        Right eye: No discharge.        Left eye: No discharge.     Extraocular Movements: Extraocular movements intact.     Conjunctiva/sclera: Conjunctivae normal.     Pupils: Pupils are equal, round, and reactive to light.  Neck:     Thyroid: No thyroid mass, thyromegaly or thyroid tenderness.     Vascular: Normal carotid pulses. No carotid bruit, hepatojugular reflux or JVD.     Trachea: Trachea and phonation normal.  Cardiovascular:     Rate and Rhythm: Normal rate and regular rhythm.     Pulses: Normal pulses.     Heart sounds: Normal heart sounds. No murmur heard.    No friction rub. No gallop.  Pulmonary:     Effort: Pulmonary effort is normal.     Breath sounds: Normal breath sounds. No decreased breath sounds, wheezing, rhonchi or rales.  Chest:  Breasts:    Right: Normal. No swelling, bleeding, inverted nipple, mass, nipple discharge, skin change or tenderness.     Left: Normal. No swelling, bleeding, inverted nipple, mass, nipple discharge, skin change or tenderness.  Abdominal:     General: Bowel sounds are normal.     Palpations: Abdomen is soft. There is no hepatomegaly, splenomegaly or mass.     Tenderness: There is no abdominal tenderness. There is no guarding.     Hernia: No hernia is present.  Genitourinary:    Rectum: Normal. Guaiac result negative. No mass.  Musculoskeletal:        General: Normal range of motion.     Cervical back: Normal range of motion and neck supple.   Lymphadenopathy:     Head:     Right side of head: No submental or submandibular adenopathy.     Left side of head: No submental or submandibular  adenopathy.     Cervical: No cervical adenopathy.     Right cervical: No superficial, deep or posterior cervical adenopathy.    Left cervical: No superficial, deep or posterior cervical adenopathy.  Skin:    General: Skin is warm and dry.     Capillary Refill: Capillary refill takes less than 2 seconds.     Findings: No bruising.  Neurological:     Mental Status: She is alert.     Cranial Nerves: Cranial nerves 2-12 are intact.     Sensory: Sensation is intact.     Motor: Motor function is intact.     Coordination: Romberg sign negative.     Deep Tendon Reflexes: Reflexes are normal and symmetric.  Psychiatric:        Behavior: Behavior is cooperative.     Wt Readings from Last 3 Encounters:  06/27/23 137 lb 6.4 oz (62.3 kg)  07/04/22 136 lb 11 oz (62 kg)  06/21/22 138 lb (62.6 kg)    BP 138/82   Pulse 78   Ht 5\' 2"  (1.575 m)   Wt 137 lb 6.4 oz (62.3 kg)   SpO2 94%   BMI 25.13 kg/m   Assessment and Plan: Linda Salas is a 63 y.o. female who presents today for her Complete Annual Exam. She feels well. She reports exercising /walk. She reports she is sleeping fairly well.Immunizations are reviewed and recommendations provided.   Age appropriate screening tests are discussed. Counseling given for risk factor reduction interventions.    1. Annual physical exam (Primary) No subjective/objective concerns noted during HPI, review of systems, review of past medical history/past labs within the past year/review of medications and physical exam.  Will check CMP lipid panel and CBC per usual annual physical protocol.  Will recheck patient in 1 year.   - Comprehensive metabolic panel - Lipid Panel With LDL/HDL Ratio - CBC with Differential/Platelet  2. Breast cancer screening by mammogram Breast exam performed by physician with patient  in supine position.  There is no palpable masses or abnormalities noted during the breast exam.  Axillary adenopathy was not detected.  Will proceed with mammography 3D screening.  3. Hypercholesterolemia Chronic.  Controlled.  Stable.  Will check lipid panel for current level of LDL control. - Lipid Panel With LDL/HDL Ratio  4. Colon cancer screening Review of past colonoscopy that patient is due.  No previous polyps or colon abnormality noted in the past.  Direct rectal exam was unremarkable and guaiac was negative. - Ambulatory referral to Gastroenterology  5. History of anemia Patient has a history of anemia and we will check CBC for current hemoglobin hematocrit concerns. - CBC with Differential/Platelet    Elizabeth Sauer, MD

## 2023-06-28 ENCOUNTER — Encounter: Payer: Self-pay | Admitting: Family Medicine

## 2023-06-28 LAB — CBC WITH DIFFERENTIAL/PLATELET
Basophils Absolute: 0 10*3/uL (ref 0.0–0.2)
Basos: 0 %
EOS (ABSOLUTE): 0 10*3/uL (ref 0.0–0.4)
Eos: 0 %
Hematocrit: 36.6 % (ref 34.0–46.6)
Hemoglobin: 12.1 g/dL (ref 11.1–15.9)
Immature Grans (Abs): 0 10*3/uL (ref 0.0–0.1)
Immature Granulocytes: 0 %
Lymphocytes Absolute: 3.1 10*3/uL (ref 0.7–3.1)
Lymphs: 52 %
MCH: 28.5 pg (ref 26.6–33.0)
MCHC: 33.1 g/dL (ref 31.5–35.7)
MCV: 86 fL (ref 79–97)
Monocytes Absolute: 0.5 10*3/uL (ref 0.1–0.9)
Monocytes: 8 %
Neutrophils Absolute: 2.4 10*3/uL (ref 1.4–7.0)
Neutrophils: 40 %
Platelets: 433 10*3/uL (ref 150–450)
RBC: 4.25 x10E6/uL (ref 3.77–5.28)
RDW: 11.9 % (ref 11.7–15.4)
WBC: 6.1 10*3/uL (ref 3.4–10.8)

## 2023-06-28 LAB — COMPREHENSIVE METABOLIC PANEL
ALT: 10 IU/L (ref 0–32)
AST: 16 IU/L (ref 0–40)
Albumin: 4.2 g/dL (ref 3.9–4.9)
Alkaline Phosphatase: 82 IU/L (ref 44–121)
BUN/Creatinine Ratio: 10 — ABNORMAL LOW (ref 12–28)
BUN: 9 mg/dL (ref 8–27)
Bilirubin Total: 0.4 mg/dL (ref 0.0–1.2)
CO2: 25 mmol/L (ref 20–29)
Calcium: 10.2 mg/dL (ref 8.7–10.3)
Chloride: 104 mmol/L (ref 96–106)
Creatinine, Ser: 0.87 mg/dL (ref 0.57–1.00)
Globulin, Total: 2.8 g/dL (ref 1.5–4.5)
Glucose: 77 mg/dL (ref 70–99)
Potassium: 4.7 mmol/L (ref 3.5–5.2)
Sodium: 143 mmol/L (ref 134–144)
Total Protein: 7 g/dL (ref 6.0–8.5)
eGFR: 74 mL/min/{1.73_m2} (ref >59)

## 2023-06-28 LAB — LIPID PANEL WITH LDL/HDL RATIO
Cholesterol, Total: 211 mg/dL — ABNORMAL HIGH (ref 100–199)
HDL: 116 mg/dL (ref >39)
LDL Chol Calc (NIH): 87 mg/dL (ref 0–99)
LDL/HDL Ratio: 0.8 ratio (ref 0.0–3.2)
Triglycerides: 39 mg/dL (ref 0–149)
VLDL Cholesterol Cal: 8 mg/dL (ref 5–40)

## 2023-07-04 ENCOUNTER — Other Ambulatory Visit: Payer: Self-pay

## 2023-07-04 ENCOUNTER — Telehealth: Payer: Self-pay

## 2023-07-04 DIAGNOSIS — Z1211 Encounter for screening for malignant neoplasm of colon: Secondary | ICD-10-CM

## 2023-07-04 MED ORDER — NA SULFATE-K SULFATE-MG SULF 17.5-3.13-1.6 GM/177ML PO SOLN: 1.0000 | Freq: Once | ORAL | 0 refills | Status: AC

## 2023-07-04 NOTE — Telephone Encounter (Signed)
 Gastroenterology Pre-Procedure Review  Request Date: 07/29/23 Requesting Physician: Dr. Allegra Lai  PATIENT REVIEW QUESTIONS: The patient responded to the following health history questions as indicated:    1. Are you having any GI issues? no 2. Do you have a personal history of Polyps? no 3. Do you have a family history of Colon Cancer or Polyps? no 4. Diabetes Mellitus? no 5. Joint replacements in the past 12 months?no 6. Major health problems in the past 3 months?no 7. Any artificial heart valves, MVP, or defibrillator?no    MEDICATIONS & ALLERGIES:    Patient reports the following regarding taking any anticoagulation/antiplatelet therapy:   Plavix, Coumadin, Eliquis, Xarelto, Lovenox, Pradaxa, Brilinta, or Effient? no Aspirin? no  Patient confirms/reports the following medications:  Current Outpatient Medications  Medication Sig Dispense Refill   Ascorbic Acid (VITAMIN C) 1000 MG tablet Take 1,000 mg by mouth daily.     cholecalciferol (VITAMIN D3) 25 MCG (1000 UNIT) tablet Take 1,000 Units by mouth daily.     fluticasone (FLONASE) 50 MCG/ACT nasal spray Place 2 sprays into both nostrils daily. OTC     No current facility-administered medications for this visit.    Patient confirms/reports the following allergies:  Allergies  Allergen Reactions   Hydrocodone Shortness Of Breath    No orders of the defined types were placed in this encounter.   AUTHORIZATION INFORMATION Primary Insurance: 1D#: Group #:  Secondary Insurance: 1D#: Group #:  SCHEDULE INFORMATION: Date: 07/29/23 Time: Location: [facility]

## 2023-07-19 ENCOUNTER — Encounter: Payer: Self-pay | Admitting: Gastroenterology

## 2023-07-19 NOTE — Anesthesia Preprocedure Evaluation (Addendum)
 Anesthesia Evaluation  Patient identified by MRN, date of birth, ID band Patient awake    Reviewed: Allergy & Precautions, H&P , NPO status , Patient's Chart, lab work & pertinent test results  Airway Mallampati: II  TM Distance: >3 FB Neck ROM: Full    Dental no notable dental hx. (+) Partial Lower, Partial Upper   Pulmonary neg pulmonary ROS   Pulmonary exam normal breath sounds clear to auscultation       Cardiovascular negative cardio ROS Normal cardiovascular exam Rhythm:Regular Rate:Normal     Neuro/Psych negative neurological ROS  negative psych ROS   GI/Hepatic negative GI ROS, Neg liver ROS,,,  Endo/Other  negative endocrine ROS    Renal/GU negative Renal ROS  negative genitourinary   Musculoskeletal negative musculoskeletal ROS (+)    Abdominal   Peds negative pediatric ROS (+)  Hematology negative hematology ROS (+)   Anesthesia Other Findings No medical history  Reproductive/Obstetrics negative OB ROS                             Anesthesia Physical Anesthesia Plan  ASA: 1  Anesthesia Plan: General   Post-op Pain Management:    Induction: Intravenous  PONV Risk Score and Plan:   Airway Management Planned: Natural Airway and Nasal Cannula  Additional Equipment:   Intra-op Plan:   Post-operative Plan:   Informed Consent: I have reviewed the patients History and Physical, chart, labs and discussed the procedure including the risks, benefits and alternatives for the proposed anesthesia with the patient or authorized representative who has indicated his/her understanding and acceptance.     Dental Advisory Given  Plan Discussed with: Anesthesiologist, CRNA and Surgeon  Anesthesia Plan Comments: (Patient consented for risks of anesthesia including but not limited to:  - adverse reactions to medications - risk of airway placement if required - damage to eyes,  teeth, lips or other oral mucosa - nerve damage due to positioning  - sore throat or hoarseness - Damage to heart, brain, nerves, lungs, other parts of body or loss of life  Patient voiced understanding and assent.)       Anesthesia Quick Evaluation

## 2023-07-29 ENCOUNTER — Encounter
Admission: RE | Disposition: A | Discharge status: Home / Self Care | Payer: Self-pay | Source: Home / Self Care | Attending: Gastroenterology

## 2023-07-29 ENCOUNTER — Ambulatory Visit: Payer: Self-pay | Admitting: Anesthesiology

## 2023-07-29 ENCOUNTER — Ambulatory Visit
Admission: RE | Admit: 2023-07-29 | Discharge: 2023-07-29 | Disposition: A | Discharge status: Home / Self Care | Attending: Gastroenterology | Admitting: Gastroenterology

## 2023-07-29 ENCOUNTER — Encounter: Payer: Self-pay | Admitting: Gastroenterology

## 2023-07-29 DIAGNOSIS — K635 Polyp of colon: Secondary | ICD-10-CM

## 2023-07-29 DIAGNOSIS — Z1211 Encounter for screening for malignant neoplasm of colon: Secondary | ICD-10-CM | POA: Diagnosis not present

## 2023-07-29 DIAGNOSIS — K5730 Diverticulosis of large intestine without perforation or abscess without bleeding: Secondary | ICD-10-CM | POA: Insufficient documentation

## 2023-07-29 DIAGNOSIS — D123 Benign neoplasm of transverse colon: Secondary | ICD-10-CM | POA: Diagnosis not present

## 2023-07-29 HISTORY — PX: COLONOSCOPY: SHX5424

## 2023-07-29 HISTORY — PX: POLYPECTOMY: SHX149

## 2023-07-29 SURGERY — COLONOSCOPY
Anesthesia: General | Site: Rectum

## 2023-07-29 MED ORDER — STERILE WATER FOR IRRIGATION IR SOLN
Status: DC | PRN
  Administered 2023-07-29: 50 mL

## 2023-07-29 MED ORDER — SODIUM CHLORIDE 0.9 % IV SOLN
INTRAVENOUS | Status: DC
Start: 2023-07-29 — End: 2023-07-29

## 2023-07-29 MED ORDER — LIDOCAINE HCL (CARDIAC) PF 100 MG/5ML IV SOSY
PREFILLED_SYRINGE | INTRAVENOUS | Status: DC | PRN
  Administered 2023-07-29: 50 mg via INTRAVENOUS

## 2023-07-29 MED ORDER — STERILE WATER FOR IRRIGATION IR SOLN
Status: DC | PRN
  Administered 2023-07-29: 60 mL

## 2023-07-29 MED ORDER — LACTATED RINGERS IV SOLN: INTRAVENOUS | Status: DC

## 2023-07-29 MED ORDER — PROPOFOL 10 MG/ML IV BOLUS
INTRAVENOUS | Status: DC | PRN
  Administered 2023-07-29: 40 mg via INTRAVENOUS
  Administered 2023-07-29: 90 mg via INTRAVENOUS
  Administered 2023-07-29: 50 mg via INTRAVENOUS
  Administered 2023-07-29: 20 mg via INTRAVENOUS
  Administered 2023-07-29: 50 mg via INTRAVENOUS

## 2023-07-29 SURGICAL SUPPLY — 7 items
GAUZE SPONGE 4X4 12PLY STRL (GAUZE/BANDAGES/DRESSINGS) IMPLANT
GOWN CVR UNV OPN BCK APRN NK (MISCELLANEOUS) ×4 IMPLANT
KIT PRC NS LF DISP ENDO (KITS) ×2 IMPLANT
MANIFOLD NEPTUNE II (INSTRUMENTS) ×2 IMPLANT
SNARE COLD EXACTO (MISCELLANEOUS) IMPLANT
TRAP ETRAP POLY (MISCELLANEOUS) IMPLANT
WATER STERILE IRR 250ML POUR (IV SOLUTION) ×2 IMPLANT

## 2023-07-29 NOTE — Transfer of Care (Signed)
 Immediate Anesthesia Transfer of Care Note  Patient: Linda Salas  Procedure(s) Performed: COLONOSCOPY WITH BIOPSY (Rectum) POLYPECTOMY, INTESTINE (Rectum)  Patient Location: PACU  Anesthesia Type: General  Level of Consciousness: awake, alert  and patient cooperative  Airway and Oxygen Therapy: Patient Spontanous Breathing and Patient connected to supplemental oxygen  Post-op Assessment: Post-op Vital signs reviewed, Patient's Cardiovascular Status Stable, Respiratory Function Stable, Patent Airway and No signs of Nausea or vomiting  Post-op Vital Signs: Reviewed and stable  Complications: No notable events documented.

## 2023-07-29 NOTE — Op Note (Signed)
 [facility] Gastroenterology Patient Name: Linda Salas Procedure Date: 07/29/2023 12:18 PM MRN: [id number] Account #: [id number] Date of Birth: 09-Jul-1960 Admit Type: Outpatient Age: 63 Room: [facility] OR ROOM 01 Gender: Female Note Status: Finalized Instrument Name: [id number] Procedure:             Colonoscopy Indications:           Screening for colorectal malignant neoplasm Providers:             Toney Reil MD, MD Referring MD:          Duanne Limerick, MD (Referring MD) Medicines:             General Anesthesia Complications:         No immediate complications. Estimated blood loss: None. Procedure:             Pre-Anesthesia Assessment:                        - Prior to the procedure, a History and Physical was                         performed, and patient medications and allergies were                         reviewed. The patient is competent. The risks and                         benefits of the procedure and the sedation options and                         risks were discussed with the patient. All questions                         were answered and informed consent was obtained.                         Patient identification and proposed procedure were                         verified by the physician, the nurse, the                         anesthesiologist, the anesthetist and the technician                         in the pre-procedure area in the procedure room in the                         endoscopy suite. Mental Status Examination: alert and                         oriented. Airway Examination: normal oropharyngeal                         airway and neck mobility. Respiratory Examination:                         clear to auscultation. CV Examination: normal.  Prophylactic Antibiotics: The patient does not require                         prophylactic antibiotics. Prior Anticoagulants: The                         patient has  taken no anticoagulant or antiplatelet                         agents. ASA Grade Assessment: I - A normal, healthy                         patient. After reviewing the risks and benefits, the                         patient was deemed in satisfactory condition to                         undergo the procedure. The anesthesia plan was to use                         general anesthesia. Immediately prior to                         administration of medications, the patient was                         re-assessed for adequacy to receive sedatives. The                         heart rate, respiratory rate, oxygen saturations,                         blood pressure, adequacy of pulmonary ventilation, and                         response to care were monitored throughout the                         procedure. The physical status of the patient was                         re-assessed after the procedure.                        After obtaining informed consent, the colonoscope was                         passed under direct vision. Throughout the procedure,                         the patient's blood pressure, pulse, and oxygen                         saturations were monitored continuously. The                         Colonoscope was introduced through the anus and  advanced to the the cecum, identified by appendiceal                         orifice and ileocecal valve. The colonoscopy was                         performed without difficulty. The patient tolerated                         the procedure well. The quality of the bowel                         preparation was evaluated using the BBPS [facility] Bowel                         Preparation Scale) with scores of: Right Colon = 3,                         Transverse Colon = 3 and Left Colon = 3 (entire mucosa                         seen well with no residual staining, small fragments                         of stool or  opaque liquid). The total BBPS score                         equals 9. The ileocecal valve, appendiceal orifice,                         and rectum were photographed. Findings:      The perianal and digital rectal examinations were normal. Pertinent       negatives include normal sphincter tone and no palpable rectal lesions.      A 5 mm polyp was found in the transverse colon. The polyp was sessile.       The polyp was removed with a cold snare. Resection and retrieval were       complete. Estimated blood loss: none.      Multiple medium-mouthed diverticula were found in the recto-sigmoid       colon, sigmoid colon and descending colon.      The retroflexed view of the distal rectum and anal verge was normal and       showed no anal or rectal abnormalities. Impression:            - One 5 mm polyp in the transverse colon, removed with                         a cold snare. Resected and retrieved.                        - Diverticulosis in the recto-sigmoid colon, in the                         sigmoid colon and in the descending colon.                        - The  distal rectum and anal verge are normal on                         retroflexion view. Recommendation:        - Discharge patient to home (with escort).                        - Resume previous diet today.                        - Continue present medications.                        - Await pathology results.                        - Repeat colonoscopy in 5 to 7 years for surveillance                         based on pathology results. Procedure Code(s):     --- Professional ---                        [phone], Colonoscopy, flexible; with removal of                         tumor(s), polyp(s), or other lesion(s) by snare                         technique Diagnosis Code(s):     --- Professional ---                        Z12.11, Encounter for screening for malignant neoplasm                         of colon                         D12.3, Benign neoplasm of transverse colon (hepatic                         flexure or splenic flexure)                        K57.30, Diverticulosis of large intestine without                         perforation or abscess without bleeding CPT copyright 2022 American Medical Association. All rights reserved. The codes documented in this report are preliminary and upon coder review may  be revised to meet current compliance requirements. Dr. Libby Maw Toney Reil MD, MD 07/29/2023 12:42:03 PM This report has been signed electronically. Number of Addenda: 0 Note Initiated On: 07/29/2023 12:18 PM Scope Withdrawal Time: 0 hours 6 minutes 49 seconds  Total Procedure Duration: 0 hours 9 minutes 48 seconds  Estimated Blood Loss:  Estimated blood loss: none.      [facility]

## 2023-07-29 NOTE — Anesthesia Postprocedure Evaluation (Signed)
 Anesthesia Post Note  Patient: Linda Salas  Procedure(s) Performed: COLONOSCOPY WITH BIOPSY (Rectum) POLYPECTOMY, INTESTINE (Rectum)  Patient location during evaluation: PACU Anesthesia Type: General Level of consciousness: awake and alert Pain management: pain level controlled Vital Signs Assessment: post-procedure vital signs reviewed and stable Respiratory status: spontaneous breathing, nonlabored ventilation, respiratory function stable and patient connected to nasal cannula oxygen Cardiovascular status: blood pressure returned to baseline and stable Postop Assessment: no apparent nausea or vomiting Anesthetic complications: no   No notable events documented.   Last Vitals:  Vitals:   07/29/23 1242 07/29/23 1257  BP: 109/60 105/64  Pulse: 77   Resp: 20   Temp: 36.7 C 36.7 C  SpO2: 98%     Last Pain:  Vitals:   07/29/23 1257  TempSrc:   PainSc: 0-No pain                 Linda Salas C Gianella Chismar

## 2023-07-29 NOTE — H&P (Signed)
  Arlyss Repress, MD [address]  [address]  [place], [place] [address]  Main: [phone]  Fax: [phone] Pager: [phone]  Primary Care Physician:  Duanne Limerick, MD Primary Gastroenterologist:  Dr. Arlyss Repress  Pre-Procedure History & Physical: HPI:  Linda Salas is a 63 y.o. female is here for an colonoscopy.   History reviewed. No pertinent past medical history.  Past Surgical History:  Procedure Laterality Date   ABDOMINAL HYSTERECTOMY     BREAST BIOPSY Left 2014   neg/ Bx/clip   CHOLECYSTECTOMY  2009    Prior to Admission medications   Medication Sig Start Date End Date Taking? Authorizing Provider  latanoprost (XALATAN) 0.005 % ophthalmic solution 1 drop at bedtime.   Yes [provider]  Ascorbic Acid (VITAMIN C) 1000 MG tablet Take 1,000 mg by mouth daily.    [provider]  cholecalciferol (VITAMIN D3) 25 MCG (1000 UNIT) tablet Take 1,000 Units by mouth daily.    [provider]  fluticasone (FLONASE) 50 MCG/ACT nasal spray Place 2 sprays into both nostrils daily. OTC    [provider]    Allergies as of 07/04/2023 - Review Complete 07/04/2023  Allergen Reaction Noted   Hydrocodone Shortness Of Breath 06/21/2022    Family History  Problem Relation Age of Onset   Breast cancer Neg Hx     Social History   Socioeconomic History   Marital status: Married    Spouse name: Not on file   Number of children: Not on file   Years of education: Not on file   Highest education level: Not on file  Occupational History   Not on file  Tobacco Use   Smoking status: Never   Smokeless tobacco: Never  Vaping Use   Vaping status: Never Used  Substance and Sexual Activity   Alcohol use: Never   Drug use: Never   Sexual activity: Yes  Other Topics Concern   Not on file  Social History Narrative   Not on file   Social Drivers of Health   Financial Resource Strain: Not on file  Food Insecurity: Not on file   Transportation Needs: Not on file  Physical Activity: Not on file  Stress: Not on file  Social Connections: Not on file  Intimate Partner Violence: Not on file    Review of Systems: See HPI, otherwise negative ROS  Physical Exam: BP 135/74   Pulse 73   Temp 97.7 F (36.5 C) (Temporal)   Resp 18   Ht 5\' 2"  (1.575 m)   Wt 59.9 kg   SpO2 100%   BMI 24.14 kg/m  General:   Alert,  pleasant and cooperative in NAD Head:  Normocephalic and atraumatic. Neck:  Supple; no masses or thyromegaly. Lungs:  Clear throughout to auscultation.    Heart:  Regular rate and rhythm. Abdomen:  Soft, nontender and nondistended. Normal bowel sounds, without guarding, and without rebound.   Neurologic:  Alert and  oriented x4;  grossly normal neurologically.  Impression/Plan: Linda Salas is here for an colonoscopy to be performed for colon cancer screening  Risks, benefits, limitations, and alternatives regarding  colonoscopy have been reviewed with the patient.  Questions have been answered.  All parties agreeable.   Lannette Donath, MD  07/29/2023, 12:15 PM

## 2023-08-01 ENCOUNTER — Encounter: Payer: Self-pay | Admitting: Gastroenterology

## 2023-08-01 LAB — SURGICAL PATHOLOGY

## 2023-09-22 ENCOUNTER — Telehealth: Payer: Self-pay

## 2023-09-22 ENCOUNTER — Encounter: Payer: Self-pay | Admitting: Family Medicine

## 2023-09-22 NOTE — Telephone Encounter (Signed)
Please call pt to schedule an appointment.  KP

## 2023-09-22 NOTE — Telephone Encounter (Signed)
 Please review.  KP Copied from CRM [id number]. Topic: Referral - Question >> Sep 22, 2023 10:43 AM Sophia H wrote: Reason for CRM: Patient is needing an updated referral from provider for a mammogram, the referral she has is from her previous provider. Please advise. Thank you, best contact # [phone]

## 2023-09-22 NOTE — Telephone Encounter (Signed)
 Copied from CRM [id number]. Topic: Referral - Question >> Sep 22, 2023 10:43 AM Sophia H wrote: Reason for CRM: Patient is needing an updated referral from provider for a mammogram, the referral she has is from her previous provider. Please advise. Thank you, best contact # [phone]

## 2023-09-30 IMAGING — MG MM DIGITAL SCREENING BILAT W/ TOMO AND CAD
8 series · 9 of 24 positions shown · non-contrast
Comparison: Previous exam(s).

CLINICAL DATA: Screening.

EXAM:
DIGITAL SCREENING BILATERAL MAMMOGRAM WITH TOMOSYNTHESIS AND CAD
TECHNIQUE: Bilateral screening digital craniocaudal and mediolateral oblique
mammograms were obtained. Bilateral screening digital breast
tomosynthesis was performed. The images were evaluated with
computer-aided detection.

[L MLO synth-2D]
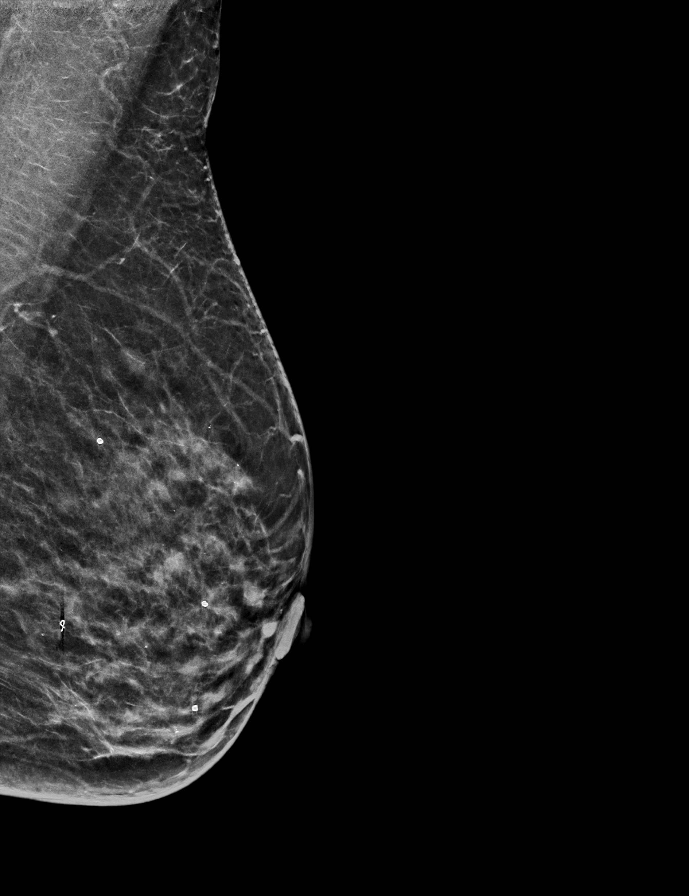

[R CC synth-2D]
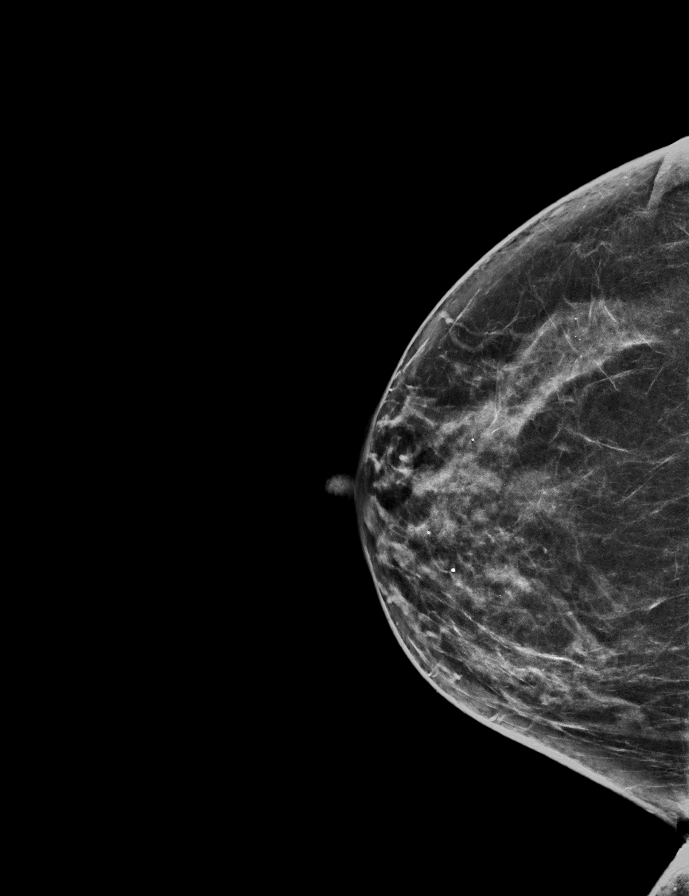

[L CC synth-2D]
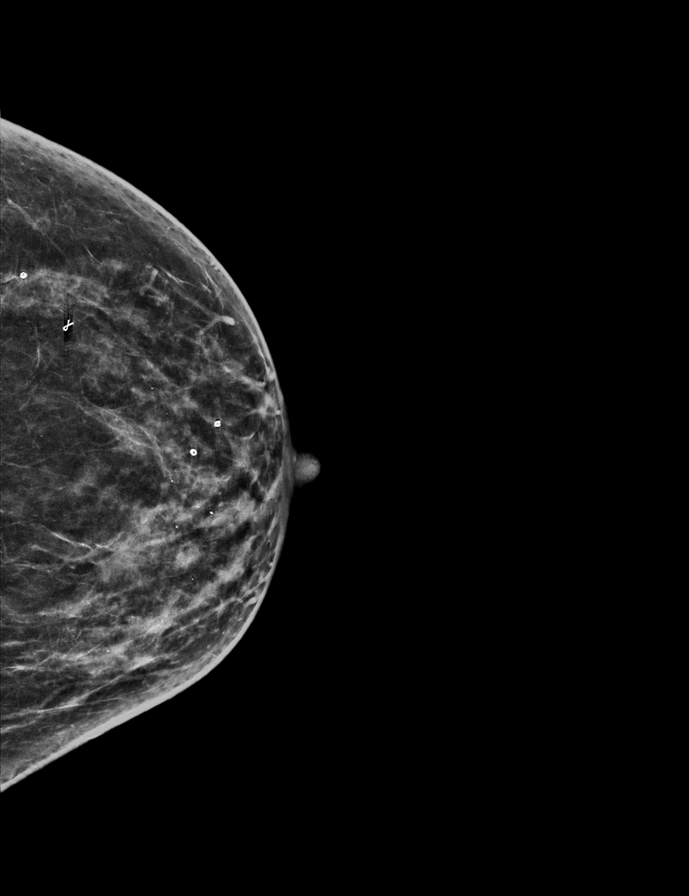

[R MLO synth-2D]
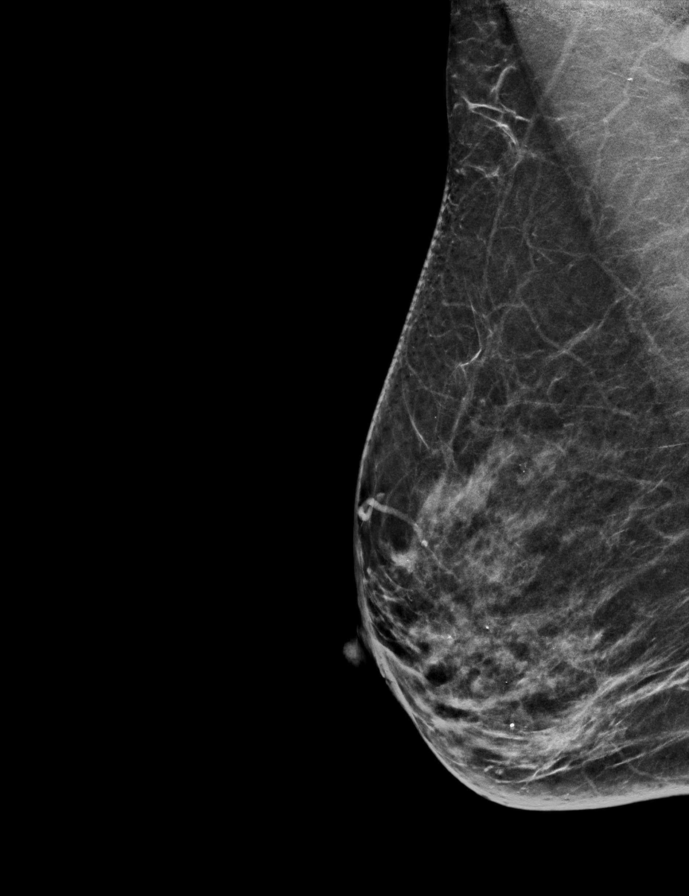

[R CC tomo · 2 of 65 frames shown]
[frame 21/65]
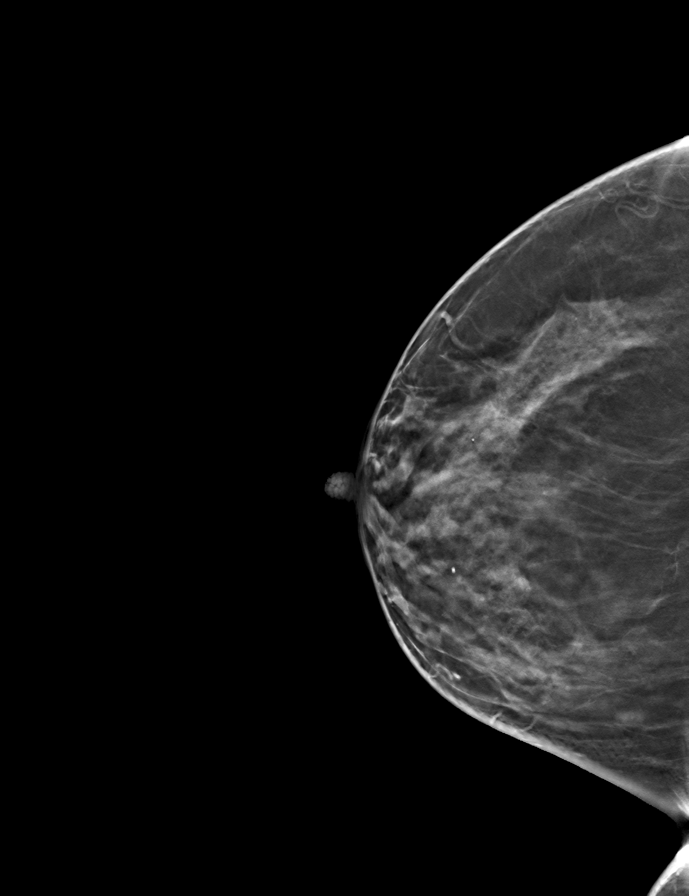
[frame 33/65]
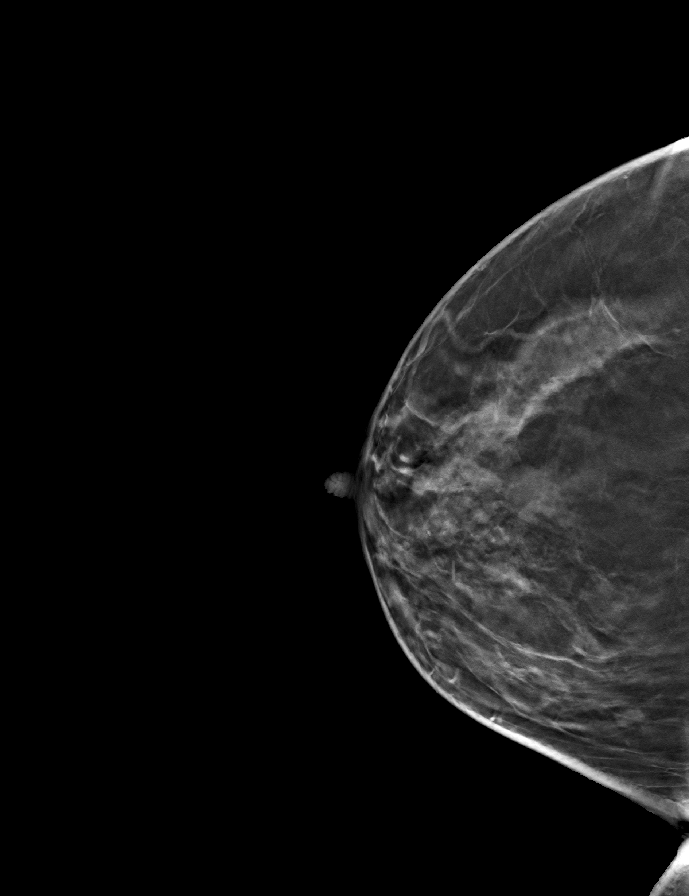

[R MLO tomo · tomo slice 31/61.0]
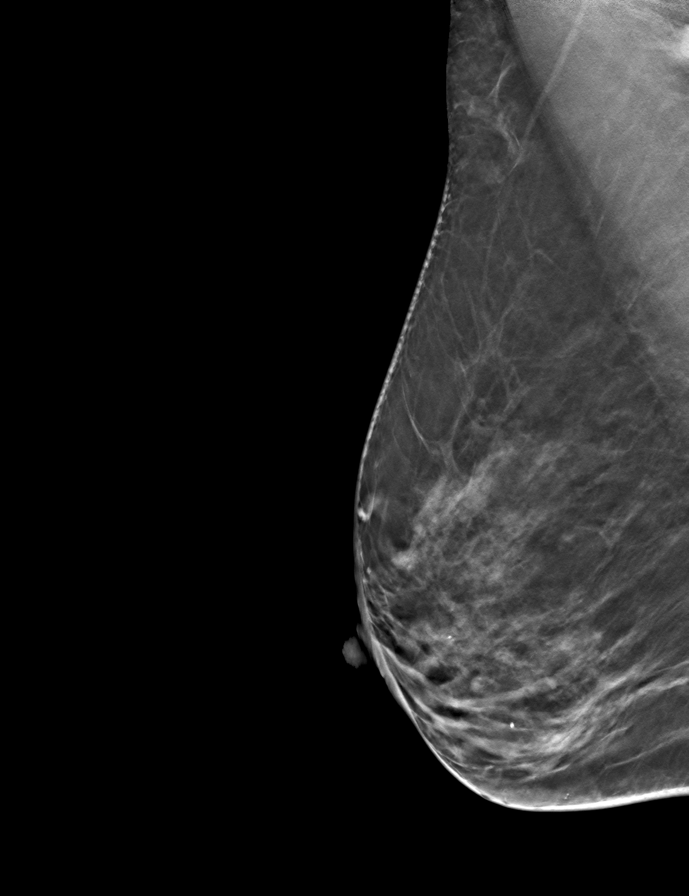

[L CC tomo · tomo slice 29/57.0]
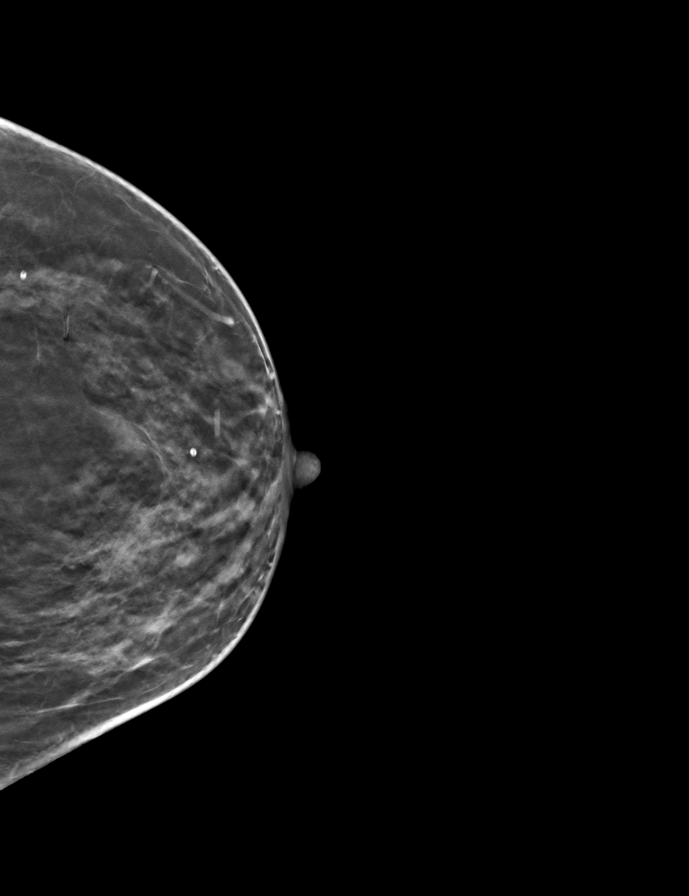

[L MLO tomo · tomo slice 29/58.0]
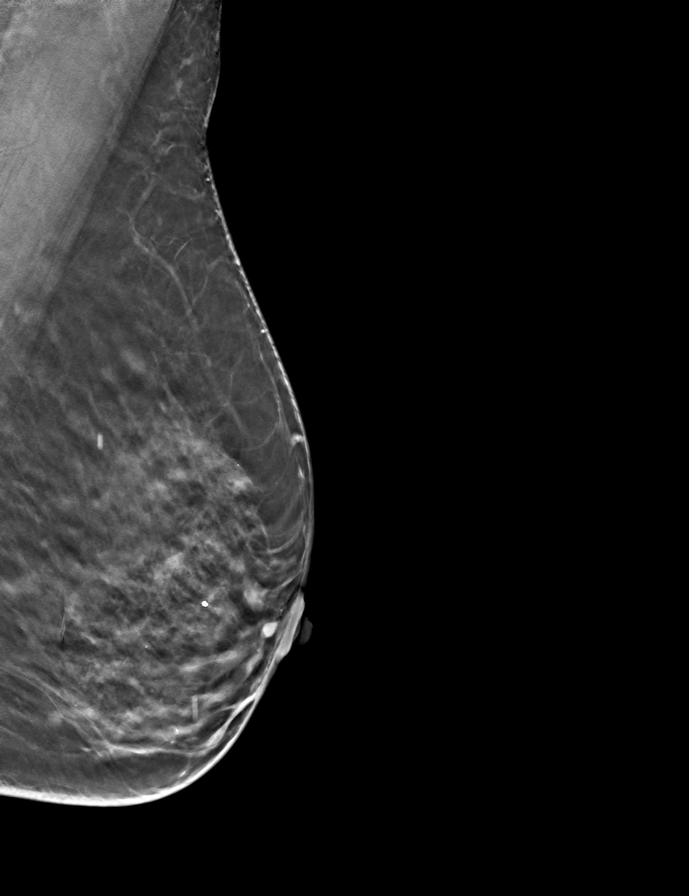

[9 of 24 positions shown; findings below may reference images not displayed]

ACR Breast Density Category c: The breast tissue is heterogeneously
dense, which may obscure small masses.
FINDINGS: There are no findings suspicious for malignancy.
IMPRESSION: No mammographic evidence of malignancy. A result letter of this
screening mammogram will be mailed directly to the patient.

RECOMMENDATION:
Screening mammogram in one year. (Code:Q3-W-BC3)

BI-RADS CATEGORY  1: Negative.

## 2023-11-18 ENCOUNTER — Ambulatory Visit: Payer: Self-pay

## 2023-11-18 NOTE — Telephone Encounter (Signed)
 FYI Only or Action Required?: FYI only for provider.  Patient was last seen in primary care on 06/27/2023 by Joshua Cathryne BROCKS, MD.  Called Nurse Triage reporting Ear Pain.  Symptoms began several days ago.  Interventions attempted: Rest, hydration, or home remedies.  Symptoms are: unchanged.  Triage Disposition: See Within 3 Days in Office (overriding See Physician Within 24 Hours) scheduled for Monday August 4th at 2:40 PM  Patient/caregiver understands and will follow disposition?: Yes  Copied from CRM [id number]. Topic: Clinical - Red Word Triage >> Nov 18, 2023  3:56 PM Avram MATSU wrote: Red Word that prompted transfer to Nurse Triage: pain behind/inside ears and shoulder    ----------------------------------------------------------------------- From previous Reason for Contact - Scheduling: Patient/patient representative is calling to schedule an appointment. Refer to attachments for appointment information. Reason for Disposition  Earache  (Exceptions: Brief ear pain of lasting less than 60 minutes, or earache occurring during air travel.)  Answer Assessment - Initial Assessment Questions 1. LOCATION: Which ear is involved?     Pain to the back of right ear involving the her neck 2. ONSET: When did the ear pain start?      Started this past Tuesday 3. SEVERITY: How bad is the pain?  (Scale 1-10; mild, moderate or severe)     No pain unless she turns her neck to the right 4. URI SYMPTOMS: Do you have a runny nose or cough?     Slight cough 5. FEVER: Do you have a fever? If Yes, ask: What is your temperature, how was it measured, and when did it start?     no 6. CAUSE: Have you been swimming recently?, How often do you use Q-TIPS?, Have you had any recent air travel or scuba diving?     no 7. OTHER SYMPTOMS: Do you have any other symptoms? (e.g., decreased hearing, dizziness, headache, stiff neck, vomiting)     Stiff neck  Patient requesting an appointment  on Monday in the afternoon  Protocols used: [facility]

## 2023-11-18 NOTE — Telephone Encounter (Signed)
 Noted  Pt has a appt.  KP

## 2023-11-21 ENCOUNTER — Ambulatory Visit: Admitting: Physician Assistant

## 2023-11-23 ENCOUNTER — Encounter: Payer: Self-pay | Admitting: Student

## 2023-11-23 ENCOUNTER — Ambulatory Visit (INDEPENDENT_AMBULATORY_CARE_PROVIDER_SITE_OTHER): Admitting: Student

## 2023-11-23 VITALS — BP 116/74 | Ht 62.0 in | Wt 133.0 lb

## 2023-11-23 DIAGNOSIS — J3089 Other allergic rhinitis: Secondary | ICD-10-CM | POA: Diagnosis not present

## 2023-11-23 MED ORDER — FLUTICASONE PROPIONATE 50 MCG/ACT NA SUSP: 2.0000 | Freq: Every day | NASAL | 1 refills | Status: AC

## 2023-11-23 NOTE — Progress Notes (Signed)
 Established Patient Office Visit  Subjective   Patient ID: Linda Salas, female    DOB: [birth year]  Age: 63 y.o. MRN: [id number]  Chief Complaint  Patient presents with   Sinusitis    Patient said she is having nasal drainage, feeling of her nose is stuff, feels like it happens when she eats dairy, pain radiating from her ears down her neck, x 1 week   Patient reports nasal congestion for the last week with fullness in both ears R>L. Symptoms are slowly improving. Reports post nasal drainage and sinus pain. Usually uses flonase  for allergies but is out. Denies fevers, chills, cough, dyspnea, or headaches. Has allergies to grass and dairy and uses flonase  as needed. Does not use an antihistamine.   Patient Active Problem List   Diagnosis Date Noted   Encounter for screening colonoscopy 07/29/2023   Polyp of transverse colon 07/29/2023      ROS Refer to HPI    Objective:     BP 116/74   Ht 5' 2 (1.575 m)   Wt 133 lb (60.3 kg)   BMI 24.33 kg/m  BP Readings from Last 3 Encounters:  11/23/23 116/74  07/29/23 105/64  06/27/23 138/82    Physical Exam Constitutional:      Appearance: Normal appearance.  HENT:     Right Ear: There is no impacted cerumen. Tympanic membrane is bulging. Tympanic membrane is not perforated or erythematous.     Left Ear: External ear normal. There is no impacted cerumen.     Nose:     Right Turbinates: Enlarged and swollen.     Left Turbinates: Enlarged and swollen.     Right Sinus: Frontal sinus tenderness present. No maxillary sinus tenderness.     Left Sinus: Frontal sinus tenderness present. No maxillary sinus tenderness.     Mouth/Throat:     Mouth: Mucous membranes are moist.     Pharynx: Oropharynx is clear. No pharyngeal swelling or posterior oropharyngeal erythema.  Cardiovascular:     Rate and Rhythm: Normal rate and regular rhythm.  Pulmonary:     Effort: Pulmonary effort is normal.     Breath sounds: No rhonchi or rales.   Abdominal:     General: Abdomen is flat. Bowel sounds are normal. There is no distension.     Palpations: Abdomen is soft.     Tenderness: There is no abdominal tenderness.  Musculoskeletal:        General: Normal range of motion.     Right lower leg: No edema.     Left lower leg: No edema.  Skin:    General: Skin is warm and dry.     Capillary Refill: Capillary refill takes less than 2 seconds.  Neurological:     General: No focal deficit present.     Mental Status: She is alert and oriented to person, place, and time.  Psychiatric:        Mood and Affect: Mood normal.        Behavior: Behavior normal.        11/23/2023    1:18 PM 06/27/2023   10:03 AM 06/21/2022   10:03 AM  Depression screen PHQ 2/9  Decreased Interest 0 0 0  Down, Depressed, Hopeless 0 0 0  PHQ - 2 Score 0 0 0  Altered sleeping 0 1 0  Tired, decreased energy 1 2 0  Change in appetite 0 0 0  Feeling bad or failure about yourself  0 0 0  Trouble  concentrating 0 0 0  Moving slowly or fidgety/restless 0 0 0  Suicidal thoughts 0 0 0  PHQ-9 Score 1 3 0  Difficult doing work/chores Not difficult at all Not difficult at all Not difficult at all       11/23/2023    1:18 PM 06/27/2023   10:03 AM 06/21/2022   10:04 AM 06/19/2021    9:53 AM  GAD 7 : Generalized Anxiety Score  Nervous, Anxious, on Edge 0 0 0 0  Control/stop worrying 0 0 0 0  Worry too much - different things 1 1 0 0  Trouble relaxing 0 0 0 0  Restless 0 0 0 0  Easily annoyed or irritable 0 0 0 0  Afraid - awful might happen 0 0 0 0  Total GAD 7 Score 1 1 0 0  Anxiety Difficulty Not difficult at all Not difficult at all  Not difficult at all    No results found for any visits on 11/23/23.  Last CBC Lab Results  Component Value Date   WBC 6.1 06/27/2023   HGB 12.1 06/27/2023   HCT 36.6 06/27/2023   MCV 86 06/27/2023   MCH 28.5 06/27/2023   RDW 11.9 06/27/2023   PLT 433 06/27/2023   Last metabolic panel Lab Results  Component Value  Date   GLUCOSE 77 06/27/2023   NA 143 06/27/2023   K 4.7 06/27/2023   CL 104 06/27/2023   CO2 25 06/27/2023   BUN 9 06/27/2023   CREATININE 0.87 06/27/2023   EGFR 74 06/27/2023   CALCIUM 10.2 06/27/2023   PHOS 3.4 06/19/2021   PROT 7.0 06/27/2023   ALBUMIN 4.2 06/27/2023   LABGLOB 2.8 06/27/2023   AGRATIO 1.6 06/21/2022   BILITOT 0.4 06/27/2023   ALKPHOS 82 06/27/2023   AST 16 06/27/2023   ALT 10 06/27/2023      The ASCVD Risk score (Arnett DK, et al., 2019) failed to calculate for the following reasons:   The valid HDL cholesterol range is 20 to 100 mg/dL    Assessment & Plan:  Allergic rhinitis due to other allergic trigger, unspecified seasonality Well appearing on exam today with stable vitals. Refilled flonase  will have her take allegra once daily for allergies until symptoms improve. Will have her avoid lactose and grass.. Nasal irrigation with distilled water .  Other orders -     Fluticasone  Propionate; Place 2 sprays into both nostrils daily. OTC  Dispense: 9.9 mL; Refill: 1     Return in about 2 weeks (around 12/07/2023), or if symptoms worsen or fail to improve.    Harlene Saddler, MD

## 2024-02-07 ENCOUNTER — Emergency Department
Admission: EM | Admit: 2024-02-07 | Discharge: 2024-02-07 | Disposition: A | Discharge status: Home / Self Care | Attending: Emergency Medicine | Admitting: Emergency Medicine

## 2024-02-07 ENCOUNTER — Other Ambulatory Visit: Payer: Self-pay

## 2024-02-07 DIAGNOSIS — R42 Dizziness and giddiness: Secondary | ICD-10-CM | POA: Diagnosis not present

## 2024-02-07 DIAGNOSIS — D72829 Elevated white blood cell count, unspecified: Secondary | ICD-10-CM | POA: Insufficient documentation

## 2024-02-07 DIAGNOSIS — R112 Nausea with vomiting, unspecified: Secondary | ICD-10-CM | POA: Insufficient documentation

## 2024-02-07 LAB — URINALYSIS, ROUTINE W REFLEX MICROSCOPIC
Bilirubin Urine: NEGATIVE
Glucose, UA: NEGATIVE mg/dL
Hgb urine dipstick: NEGATIVE
Ketones, ur: NEGATIVE mg/dL
Leukocytes,Ua: NEGATIVE
Nitrite: NEGATIVE
Protein, ur: 30 mg/dL — AB
Specific Gravity, Urine: 1.015 (ref 1.005–1.030)
pH: 8 (ref 5.0–8.0)

## 2024-02-07 LAB — COMPREHENSIVE METABOLIC PANEL WITH GFR
ALT: 12 U/L (ref 0–44)
AST: 20 U/L (ref 15–41)
Albumin: 4.1 g/dL (ref 3.5–5.0)
Alkaline Phosphatase: 72 U/L (ref 38–126)
Anion gap: 10 (ref 5–15)
BUN: 10 mg/dL (ref 8–23)
CO2: 27 mmol/L (ref 22–32)
Calcium: 9.8 mg/dL (ref 8.9–10.3)
Chloride: 101 mmol/L (ref 98–111)
Creatinine, Ser: 0.8 mg/dL (ref 0.44–1.00)
GFR, Estimated: >60 mL/min (ref >60)
Glucose, Bld: 121 mg/dL — ABNORMAL HIGH (ref 70–99)
Potassium: 4 mmol/L (ref 3.5–5.1)
Sodium: 138 mmol/L (ref 135–145)
Total Bilirubin: 0.8 mg/dL (ref 0.0–1.2)
Total Protein: 7.6 g/dL (ref 6.5–8.1)

## 2024-02-07 LAB — CBC
HCT: 37.5 % (ref 36.0–46.0)
Hemoglobin: 12.5 g/dL (ref 12.0–15.0)
MCH: 29 pg (ref 26.0–34.0)
MCHC: 33.3 g/dL (ref 30.0–36.0)
MCV: 87 fL (ref 80.0–100.0)
Platelets: 405 K/uL — ABNORMAL HIGH (ref 150–400)
RBC: 4.31 MIL/uL (ref 3.87–5.11)
RDW: 12.7 % (ref 11.5–15.5)
WBC: 13 K/uL — ABNORMAL HIGH (ref 4.0–10.5)
nRBC: 0 % (ref 0.0–0.2)

## 2024-02-07 LAB — LIPASE, BLOOD: Lipase: 29 U/L (ref 11–51)

## 2024-02-07 MED ORDER — ONDANSETRON 8 MG PO TBDP: 8.0000 mg | ORAL_TABLET | Freq: Three times a day (TID) | ORAL | 0 refills | Status: AC | PRN

## 2024-02-07 MED ORDER — MECLIZINE HCL 25 MG PO TABS: 25.0000 mg | ORAL_TABLET | Freq: Three times a day (TID) | ORAL | 0 refills | Status: AC | PRN

## 2024-02-07 NOTE — ED Provider Notes (Signed)
 [facility] Provider Note   Event Date/Time   First MD Initiated Contact with Patient 02/07/24 1501     (approximate) History  Nausea and Emesis  HPI Linda Salas is a 63 y.o. 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  Patient states that she has had 2 episodes of nausea/vomiting however the symptoms have improved at this time.  Patient denies any further symptoms of dizziness or vertigo at this time as well.  Patient states she has had similar symptoms in the past due to her vertigo but has not taken any medications for the symptoms today. ROS: Patient currently denies any vision changes, tinnitus, difficulty speaking, facial droop, sore throat, chest pain, shortness of breath, abdominal pain, diarrhea, dysuria, or weakness/numbness/paresthesias in any extremity   Physical Exam  Triage Vital Signs: ED Triage Vitals  Encounter Vitals Group     BP 02/07/24 1336 120/73     Girls Systolic BP Percentile --      Girls Diastolic BP Percentile --      Boys Systolic BP Percentile --      Boys Diastolic BP Percentile --      Pulse Rate 02/07/24 1336 82     Resp 02/07/24 1336 18     Temp 02/07/24 1336 (!) 97.5 F (36.4 C)     Temp Source 02/07/24 1336 Oral     SpO2 02/07/24 1336 100 %     Weight 02/07/24 1336 134 lb (60.8 kg)     Height 02/07/24 1336 5' 2 (1.575 m)     Head Circumference --      Peak Flow --      Pain Score 02/07/24 1358 0     Pain Loc --      Pain Education --      Exclude from Growth Chart --    Most recent vital signs: Vitals:   02/07/24 1336  BP: 120/73  Pulse: 82  Resp: 18  Temp: (!) 97.5 F (36.4 C)  SpO2: 100%   General: Awake, oriented x4. CV:  Good peripheral perfusion. Resp:  Normal effort. Abd:  No distention. Other:  Elderly well-developed, well-nourished African-American female resting  comfortably in no acute distress ED Results / Procedures / Treatments  Labs (all labs ordered are listed, but only abnormal results are displayed) Labs Reviewed  COMPREHENSIVE METABOLIC PANEL WITH GFR - Abnormal; Notable for the following components:      Result Value   Glucose, Bld 121 (*)    All other components within normal limits  CBC - Abnormal; Notable for the following components:   WBC 13.0 (*)    Platelets 405 (*)    All other components within normal limits  URINALYSIS, ROUTINE W REFLEX MICROSCOPIC - Abnormal; Notable for the following components:   Color, Urine YELLOW (*)    APPearance TURBID (*)    Protein, ur 30 (*)    Bacteria, UA FEW (*)    All other components within normal limits  LIPASE, BLOOD   PROCEDURES: Critical Care performed: No Procedures MEDICATIONS ORDERED IN ED: Medications - No data to display IMPRESSION / MDM / ASSESSMENT AND PLAN / ED COURSE  I reviewed the triage vital signs and the nursing notes.  The patient is on the cardiac monitor to evaluate for evidence of arrhythmia and/or significant heart rate changes. Patient's presentation is most consistent with acute presentation with potential threat to life or bodily function. Patient is a 63 year old female with the above-stated past medical history presents after an episode of dizziness with associated nausea/vomiting.  The symptoms are similar to symptoms of vertigo that she has had in the past. DDx: Gastroenteritis, food poisoning, central vertigo, BPPV Plan: CBC, CMP, UA CBC shows mild leukocytosis at 13 likely related to esophagitis secondary to nausea/vomiting  Patient given his results and discussed the likelihood of these diagnoses.  Patient agrees that this is likely symptoms of vertigo which she has had in the past.  Patient does not have any Antivert or Zofran for the symptoms in case they recur and therefore will be given a prescription.  Patient agrees with  plan for discharge at this time with PCP follow-up.  Patient given strict return precautions and all questions answered prior to discharge.  Dispo: Discharge home with PCP follow-up   FINAL CLINICAL IMPRESSION(S) / ED DIAGNOSES   Final diagnoses:  Vertigo  Nausea and vomiting, unspecified vomiting type   Rx / DC Orders   ED Discharge Orders          Ordered    meclizine (ANTIVERT) 25 MG tablet  3 times daily PRN        02/07/24 1516    ondansetron (ZOFRAN-ODT) 8 MG disintegrating tablet  Every 8 hours PRN        02/07/24 1516           Note:  This document was prepared using Dragon voice recognition software and may include unintentional dictation errors.   Odin Mariani K, MD 02/07/24 [phone]

## 2024-02-07 NOTE — ED Triage Notes (Signed)
 Pt to ED for c/o n/v and dizziness which started this morning after eating breakfast. Pt denies diarrhea. Pt states one episode of emesis.

## 2024-02-09 ENCOUNTER — Telehealth: Payer: Self-pay

## 2024-02-09 NOTE — Transitions of Care (Post Inpatient/ED Visit) (Signed)
   02/09/2024  Name: Linda Salas MRN: [id number] DOB: 14-May-1960  Today's TOC FU Call Status: Today's TOC FU Call Status:: Successful TOC FU Call Completed TOC FU Call Complete Date: 02/09/24 Patient's Name and Date of Birth confirmed.  Transition Care Management Follow-up Telephone Call Date of Discharge: 02/07/24 Discharge Facility: [facility]) Type of Discharge: Emergency Department Reason for ED Visit: Other: (Dizziness and giddiness) How have you been since you were released from the hospital?: Better Any questions or concerns?: No  Items Reviewed: Did you receive and understand the discharge instructions provided?: No Medications obtained,verified, and reconciled?: Yes (Medications Reviewed) Any new allergies since your discharge?: No Dietary orders reviewed?: NA Do you have support at home?: No  Medications Reviewed Today: Medications Reviewed Today     Reviewed by Camacho Ocampo, Anil Havard M, CMA (Certified Medical Assistant) on 02/09/24 at 1533  Med List Status: <None>   Medication Order Taking? Sig Documenting Provider Last Dose Status Informant  Ascorbic Acid (VITAMIN C) 1000 MG tablet [id number]  Take 1,000 mg by mouth daily. [provider]  Active   cholecalciferol (VITAMIN D3) 25 MCG (1000 UNIT) tablet [id number]  Take 1,000 Units by mouth daily. [provider]  Active   fluticasone  (FLONASE ) 50 MCG/ACT nasal spray [id number]  Place 2 sprays into both nostrils daily. OTC Lemon Raisin, MD  Active   latanoprost (XALATAN) 0.005 % ophthalmic solution [id number]  1 drop at bedtime. [provider]  Active   meclizine (ANTIVERT) 25 MG tablet [id number]  Take 1 tablet (25 mg total) by mouth 3 (three) times daily as needed for dizziness. Bradler, Evan K, MD  Active   ondansetron (ZOFRAN-ODT) 8 MG disintegrating tablet [id number]  Take 1 tablet (8 mg total) by mouth every 8 (eight) hours as needed for nausea or vomiting.  Bradler, Evan K, MD  Active             Home Care and Equipment/Supplies: Were Home Health Services Ordered?: NA Any new equipment or medical supplies ordered?: NA  Functional Questionnaire: Do you need assistance with bathing/showering or dressing?: No Do you need assistance with meal preparation?: No Do you need assistance with eating?: No Do you have difficulty maintaining continence: No Do you need assistance with getting out of bed/getting out of a chair/moving?: No Do you have difficulty managing or taking your medications?: No  Follow up appointments reviewed: PCP Follow-up appointment confirmed?: No (patient declined appointment) MD Provider Line Number:[phone] Given: Yes Specialist Hospital Follow-up appointment confirmed?: No Do you need transportation to your follow-up appointment?: No Do you understand care options if your condition(s) worsen?: Yes-patient verbalized understanding    Houda Brau Kristie Harsh, CMA  [facility] at [facility] [address] [place], [place] [address] Office: [phone] Fax: [phone]

## 2024-06-29 ENCOUNTER — Encounter: Admitting: Student
